# Patient Record
Sex: Female | Born: 1940 | Race: White | Hispanic: No | Smoking: Former smoker
Health system: Southern US, Community
[De-identification: ages and names within clinical notes are randomized; demographics above are authoritative.]

## PROBLEM LIST (undated history)

## (undated) DIAGNOSIS — K5792 Diverticulitis of intestine, part unspecified, without perforation or abscess without bleeding: Secondary | ICD-10-CM

## (undated) DIAGNOSIS — T7840XA Allergy, unspecified, initial encounter: Secondary | ICD-10-CM

## (undated) DIAGNOSIS — K219 Gastro-esophageal reflux disease without esophagitis: Secondary | ICD-10-CM

## (undated) DIAGNOSIS — L0390 Cellulitis, unspecified: Secondary | ICD-10-CM

## (undated) DIAGNOSIS — M8580 Other specified disorders of bone density and structure, unspecified site: Secondary | ICD-10-CM

## (undated) DIAGNOSIS — M810 Age-related osteoporosis without current pathological fracture: Secondary | ICD-10-CM

## (undated) DIAGNOSIS — I700 Atherosclerosis of aorta: Secondary | ICD-10-CM

## (undated) DIAGNOSIS — M1990 Unspecified osteoarthritis, unspecified site: Secondary | ICD-10-CM

## (undated) DIAGNOSIS — Z5189 Encounter for other specified aftercare: Secondary | ICD-10-CM

## (undated) DIAGNOSIS — R931 Abnormal findings on diagnostic imaging of heart and coronary circulation: Secondary | ICD-10-CM

## (undated) HISTORY — DX: Encounter for other specified aftercare: Z51.89

## (undated) HISTORY — PX: TUBAL LIGATION: SHX77

## (undated) HISTORY — DX: Age-related osteoporosis without current pathological fracture: M81.0

## (undated) HISTORY — DX: Unspecified osteoarthritis, unspecified site: M19.90

## (undated) HISTORY — DX: Allergy, unspecified, initial encounter: T78.40XA

## (undated) HISTORY — DX: Other specified disorders of bone density and structure, unspecified site: M85.80

## (undated) HISTORY — DX: Diverticulitis of intestine, part unspecified, without perforation or abscess without bleeding: K57.92

## (undated) HISTORY — DX: Abnormal findings on diagnostic imaging of heart and coronary circulation: R93.1

## (undated) HISTORY — DX: Atherosclerosis of aorta: I70.0

## (undated) HISTORY — PX: ABDOMINAL HYSTERECTOMY: SHX81

---

## 1979-05-23 DIAGNOSIS — Z5189 Encounter for other specified aftercare: Secondary | ICD-10-CM

## 1979-05-23 DIAGNOSIS — IMO0001 Reserved for inherently not codable concepts without codable children: Secondary | ICD-10-CM

## 1979-05-23 HISTORY — DX: Reserved for inherently not codable concepts without codable children: IMO0001

## 1979-05-23 HISTORY — PX: LAPAROTOMY: SHX154

## 1979-05-23 HISTORY — DX: Encounter for other specified aftercare: Z51.89

## 1997-12-09 ENCOUNTER — Other Ambulatory Visit: Admission: RE | Admit: 1997-12-09 | Discharge: 1997-12-09 | Payer: Self-pay | Admitting: Obstetrics and Gynecology

## 1998-10-03 ENCOUNTER — Emergency Department (HOSPITAL_COMMUNITY): Admission: EM | Admit: 1998-10-03 | Discharge: 1998-10-03 | Payer: Self-pay | Admitting: Emergency Medicine

## 1999-02-02 ENCOUNTER — Other Ambulatory Visit: Admission: RE | Admit: 1999-02-02 | Discharge: 1999-02-02 | Payer: Self-pay | Admitting: Obstetrics and Gynecology

## 1999-02-02 ENCOUNTER — Encounter (INDEPENDENT_AMBULATORY_CARE_PROVIDER_SITE_OTHER): Payer: Self-pay

## 2000-04-06 ENCOUNTER — Other Ambulatory Visit: Admission: RE | Admit: 2000-04-06 | Discharge: 2000-04-06 | Payer: Self-pay | Admitting: Obstetrics and Gynecology

## 2002-08-12 HISTORY — PX: COLONOSCOPY: SHX174

## 2002-08-22 ENCOUNTER — Other Ambulatory Visit: Admission: RE | Admit: 2002-08-22 | Discharge: 2002-08-22 | Payer: Self-pay | Admitting: Obstetrics and Gynecology

## 2004-08-05 ENCOUNTER — Other Ambulatory Visit: Admission: RE | Admit: 2004-08-05 | Discharge: 2004-08-05 | Payer: Self-pay | Admitting: Obstetrics and Gynecology

## 2004-09-23 ENCOUNTER — Ambulatory Visit: Payer: Self-pay | Admitting: Family Medicine

## 2004-09-30 ENCOUNTER — Encounter: Admission: RE | Admit: 2004-09-30 | Discharge: 2004-09-30 | Payer: Self-pay | Admitting: Family Medicine

## 2004-10-03 ENCOUNTER — Ambulatory Visit: Payer: Self-pay | Admitting: Family Medicine

## 2004-10-21 ENCOUNTER — Ambulatory Visit: Payer: Self-pay | Admitting: Family Medicine

## 2008-07-01 ENCOUNTER — Other Ambulatory Visit: Payer: Self-pay | Admitting: Emergency Medicine

## 2008-07-01 ENCOUNTER — Inpatient Hospital Stay (HOSPITAL_COMMUNITY): Admission: AD | Admit: 2008-07-01 | Discharge: 2008-07-03 | Payer: Self-pay | Admitting: Internal Medicine

## 2010-09-06 LAB — CULTURE, BLOOD (ROUTINE X 2): Culture: NO GROWTH

## 2010-09-06 LAB — DIFFERENTIAL
Basophils Absolute: 0 10*3/uL (ref 0.0–0.1)
Basophils Absolute: 0.1 10*3/uL (ref 0.0–0.1)
Eosinophils Absolute: 0.3 10*3/uL (ref 0.0–0.7)
Eosinophils Absolute: 0.3 10*3/uL (ref 0.0–0.7)
Lymphs Abs: 1.9 10*3/uL (ref 0.7–4.0)
Monocytes Relative: 13 % — ABNORMAL HIGH (ref 3–12)
Monocytes Relative: 14 % — ABNORMAL HIGH (ref 3–12)
Neutro Abs: 3.1 10*3/uL (ref 1.7–7.7)

## 2010-09-06 LAB — CBC
HCT: 39.1 % (ref 36.0–46.0)
HCT: 43.3 % (ref 36.0–46.0)
Hemoglobin: 13.9 g/dL (ref 12.0–15.0)
Hemoglobin: 14.9 g/dL (ref 12.0–15.0)
MCHC: 35.6 g/dL (ref 30.0–36.0)
MCV: 92.2 fL (ref 78.0–100.0)
Platelets: 141 10*3/uL — ABNORMAL LOW (ref 150–400)
Platelets: 153 10*3/uL (ref 150–400)
RBC: 4.24 MIL/uL (ref 3.87–5.11)
RDW: 11.8 % (ref 11.5–15.5)
WBC: 5 10*3/uL (ref 4.0–10.5)
WBC: 5.6 10*3/uL (ref 4.0–10.5)

## 2010-09-06 LAB — CLOSTRIDIUM DIFFICILE EIA: C difficile Toxins A+B, EIA: NEGATIVE

## 2010-09-06 LAB — BASIC METABOLIC PANEL
BUN: 11 mg/dL (ref 6–23)
BUN: 11 mg/dL (ref 6–23)
Calcium: 8.7 mg/dL (ref 8.4–10.5)
Calcium: 8.9 mg/dL (ref 8.4–10.5)
Chloride: 109 mEq/L (ref 96–112)
Creatinine, Ser: 0.96 mg/dL (ref 0.4–1.2)
GFR calc Af Amer: >60 mL/min (ref >60)
GFR calc Af Amer: >60 mL/min (ref >60)
GFR calc non Af Amer: >60 mL/min (ref >60)
Glucose, Bld: 111 mg/dL — ABNORMAL HIGH (ref 70–99)
Glucose, Bld: 96 mg/dL (ref 70–99)
Potassium: 3.6 mEq/L (ref 3.5–5.1)

## 2010-09-06 LAB — STOOL CULTURE

## 2010-10-04 NOTE — Discharge Summary (Signed)
[name], [name]                  ACCOUNT NO.:  [id number]   MEDICAL RECORD NO.:  [id number]          PATIENT TYPE:  INP   LOCATION:  5121                         FACILITY:  [facility]   PHYSICIAN:  Altha Harm, MDDATE OF BIRTH:  08/30/1940   DATE OF ADMISSION:  07/01/2008  DATE OF DISCHARGE:  07/03/2008                               DISCHARGE SUMMARY   DISCHARGE DISPOSITION:  Home.   FINAL DISCHARGE DIAGNOSES:  1. Cellulitis of the right head and neck.  2. Diarrhea.   DISCHARGE MEDICATIONS:  1. Augmentin 875 mg p.o. b.i.d.  2. Doxycycline 100 mg p.o. b.i.d. both for a total of 7 days.   CONSULTATIONS:  None.   PROCEDURE:  None.   DIAGNOSTICS:  CT head and neck which shows cellulitis with no extension  of infection into any cavities.   CODE STATUS:  Full code.   ALLERGIES:  VANCOMYCIN.   PRIMARY CARE PHYSICIAN:  Erskine Speed, M.D.   CHIEF COMPLAINT:  Erythema and swelling of the face.   HISTORY OF PRESENT ILLNESS:  Please see the H and P dictated by Dr.  Toniann Fail for details of the HPI.   HOSPITAL COURSE:  The patient was admitted and started on IV Zosyn and  doxycycline.  The patient had improvement in her symptoms and clinical  presentation.  CT scan of the head and neck was done to exclude any  extension of the infection into indwelling soft tissue.  The CT proves  to be negative.  The patient improved clinically.  The patient is being  discharged home on Augmentin and doxycycline for a total of 7 days.   The patient had some diarrhea prior to discharge from the hospital.  This could certainly be nonspecific.  However, the patient has been on  antibiotics.  Thus the stool is being sent off for C. diff and culture.  The patient has been instructed to call the hospital tomorrow at [phone] and ask to speak to Dr. Ashley Royalty who will give her the results of  the C. diff test.  If it is positive, then I will at that time prescribe  an appropriate antibiotic  to the patient.  Nevertheless, the patient is  instructed to follow up with Dr. Chilton Si on Monday in the office.  I would  ask that Dr. Thomasene Lot office also follow up on the stool cultures here in  the event that the patient does not follow through with her end of  calling here tomorrow.  I have obtained the patient's telephone number  and will make every attempt to call her if I do not hear from her  tomorrow.   DIETARY RESTRICTIONS:  None.   PHYSICAL RESTRICTIONS:  None.   FOLLOW UP:  The patient is to follow up with Dr. Nila Nephew in his  office on Monday which is in 3 days.   Time spent in discharge process 47 minutes      Altha Harm, MD  Electronically Signed     MAM/MEDQ  D:  07/03/2008  T:  07/03/2008  Job:  [id number]   cc:   Erskine Speed, M.D.

## 2010-10-04 NOTE — H&P (Signed)
NAMEMARCINA, [name]                  ACCOUNT NO.:  [id number]   MEDICAL RECORD NO.:  [id number]          PATIENT TYPE:  INP   LOCATION:  5121                         FACILITY:  [facility]   PHYSICIAN:  Eduard Clos, MDDATE OF BIRTH:  25-Mar-1941   DATE OF ADMISSION:  07/01/2008  DATE OF DISCHARGE:                              HISTORY & PHYSICAL   PRIMARY CARE PHYSICIAN:  Erskine Speed, M.D.   CHIEF COMPLAINT:  Erythema and swelling of the face.   HISTORY OF PRESENT ILLNESS:  A 70 year old female with no significant  past medical history presented to the ER because of increasing erythema  and swelling on the face.  Patient noticed erythema and swelling of the  right ear 2 days ago, which slowly evolved and started involving the  right side of the face.   Patient initially had a mild headache 2 days ago.  Also had some  swelling in the jugular digastric area of the right side.  She thought  she may be developing streptococcal pharyngitis, as other people in the  family and the house had streptococcal pharyngitis.  Eventually, she  started noticing swelling in the right ear, which gradually worsened and  started involving her face on the right side.  Initially, she also had  some nausea, vomiting, and headache, which has completely resolved  totally on its own.  Denies any insect bite or trauma.  Since the  symptoms have grown worse, she decided to come to the ER at [facility].   At [facility], patient was given vancomycin and was planned to be  admitted for further management of cellulitis.  While receiving  vancomycin IV, patient had intense itching of face, upper anterior, and  posterior chest.  She was given Benadryl and Pepcid, after which her  itching and erythema promptly resolved of the chest.   Patient denies any shortness of breath, tongue swelling.  Denies any  abdominal pain, chest pain, shortness of breath, loss of consciousness.  Denies any headache now.  Denies any  dysuria, discharge, or diarrhea.   PAST MEDICAL HISTORY:  Nothing significant.   PAST SURGICAL HISTORY:  1. Tonsillectomy.  2. Tubal ligation.   MEDICATIONS PRIOR TO ADMISSION:  None.   ALLERGIES:  No known drug allergies.   FAMILY HISTORY:  Not available.  Patient is adopted.   SOCIAL HISTORY:  Patient does not smoke cigarettes.  Drinks wine  occasionally.  Denies any drug abuse.   REVIEW OF SYSTEMS:  As per the history of present illness, nothing else  significant.   PHYSICAL EXAMINATION:  Patient examined at bedside.  Not in acute  distress.  VITAL SIGNS:  Blood pressure is 112/75, pulse 77 per minute, temperature  99.2, respirations 18 per minute, O2 sat 99%.  HEENT:  There is erythema and swelling of the right ear, involving the  whole ear with some extension to the right side of the face with some  induration involving the face from the jugular digastric area to the  lateral aspect of the right eyebrow.  Patient is able to open her  mouth  without any difficulty.  There is no tongue swelling or any conjunctival  injection.  No pallor.  Anicteric.  The left side of the face and ears  are normal.  At this time, there are no palpable lymph nodes.  There is  no neck rigidity.  CHEST:  Bilateral air entry present.  No rhonchi, no crepitation.  HEART:  S1 and S2 heard.  ABDOMEN:  Soft.  Nontender.  Bowel sounds heard.  CNS:  Awake, alert and oriented to time, place, and person.  Moves upper  and lower extremities 5/5.  EXTREMITIES:  Peripheral pulses felt.  No edema.   LABS:  CBC:  WBC is 5.6, hemoglobin 14.9, hematocrit 43.3, platelets  143, neutrophils 56%.  Basic metabolic panel:  Sodium 138, potassium  3.8, chloride 103, carbon dioxide 28, glucose 96, BUN 11, creatinine  0.9, calcium 8.9.   ASSESSMENT:  1. Facial cellulitis.  2. Possible allergic reaction to vancomycin versus red man reaction.   PLAN:  1. Admit patient to medical floor.  2. Start patient on IV  doxycycline and Zosyn.  Patient has already had      blood cultures obtained at the ER.  Will follow those blood      cultures.  3. Will repeat a CBC and a BMET in the a.m.  4. Further recommendation as condition evolves.      Eduard Clos, MD  Electronically Signed     ANK/MEDQ  D:  07/01/2008  T:  07/02/2008  Job:  [id number]

## 2010-10-04 NOTE — H&P (Signed)
[name]                  ACCOUNT NO.:  [id number]   MEDICAL RECORD NO.:  [id number]          PATIENT TYPE:  INP   LOCATION:  5121                         FACILITY:  [facility]   PHYSICIAN:  Eduard Clos, MDDATE OF BIRTH:  1940/08/02   DATE OF ADMISSION:  07/01/2008  DATE OF DISCHARGE:                              HISTORY & PHYSICAL   PRIMARY CARE PHYSICIAN:  Erskine Speed, M.D.   CHIEF COMPLAINT:  Erythema and swelling of the face.   HISTORY OF PRESENT ILLNESS:  A 70 year old female with no significant  past medical history presented to the ER complaining of increasing  swelling and erythema of her face.  Patient states that the erythema and  swelling started 2 days ago in her right ear, gradually increased and  involved the right side of her face.  Patient initially had a headache,  was has resolved completely on Monday, so that is 2 days ago.  She also  had some nausea and vomiting.  On Monday, 2 days ago, patient also had  some fever and chills, which lasted a few hours.  Patient denies any  insect bite.  Denies any trauma.  Patient states that in her family,  some people had streptococcal pharyngitis.  Felt that she may be  developing that, as she had some lymph node swelling of the neck, which  at this time has resolved.  She eventually noted swelling in the right  ear, which totally worsened.  With all of the above-mentioned features,  patient planned to come to the ER.  In the ER, patient was given  vancomycin.   DICTATION ENDED AT THIS POINT,      Eduard Clos, MD  Electronically Signed     ANK/MEDQ  D:  07/01/2008  T:  07/02/2008  Job:  [id number]

## 2011-07-21 ENCOUNTER — Other Ambulatory Visit: Payer: Self-pay

## 2011-07-21 ENCOUNTER — Encounter (HOSPITAL_COMMUNITY): Payer: Self-pay

## 2011-07-21 ENCOUNTER — Encounter (HOSPITAL_COMMUNITY)
Admission: RE | Admit: 2011-07-21 | Discharge: 2011-07-21 | Disposition: A | Discharge status: Home / Self Care | Payer: Medicare Other | Source: Ambulatory Visit | Attending: Obstetrics and Gynecology | Admitting: Obstetrics and Gynecology

## 2011-07-21 HISTORY — DX: Gastro-esophageal reflux disease without esophagitis: K21.9

## 2011-07-21 HISTORY — DX: Encounter for other specified aftercare: Z51.89

## 2011-07-21 LAB — CBC
HCT: 46.3 % — ABNORMAL HIGH (ref 36.0–46.0)
Hemoglobin: 15.5 g/dL — ABNORMAL HIGH (ref 12.0–15.0)
Platelets: 173 10*3/uL (ref 150–400)
WBC: 5.6 10*3/uL (ref 4.0–10.5)

## 2011-07-21 NOTE — H&P (Signed)
  Patient name  [name], Setterlund DICTATION# [id number] CSN# [id number]  Juluis Mire, MD 07/21/2011 5:56 AM

## 2011-07-21 NOTE — H&P (Signed)
NAME:  [name], [name] NO.:  [id number]  MEDICAL RECORD NO.:  [id number]  LOCATION:                                 FACILITY:  PHYSICIAN:  Juluis Mire, M.D.        DATE OF BIRTH:  DATE OF ADMISSION: DATE OF DISCHARGE:                             HISTORY & PHYSICAL   DATE OF SURGERY:  July 25, 2011, this will be done at [facility].  HISTORY OF PRESENT ILLNESS:  The patient is a 71 year old, gravida 3, para 2 postmenopausal patient who presents for management of pelvic relaxation and stress incontinence.  She is scheduled to go under laparoscopic-assisted vaginal hysterectomy with bilateral salpingo- oophorectomy.  Anterior and posterior repair.  Mid urethral sling with cystoscopy.  Possible sacrospinous ligament suspension.  In relation to present admission, the patient has been having trouble with increasing pelvic relaxation.  At this point in time, she feels something bulging to the vaginal opening.  She states uncomfortable for her to sit.  She is also having trouble with worsening stress incontinence.  On evaluation, she has a severe cystocele with severe uterine descensus, moderate rectocele, cuff is intact.  Her bimanual exam is unremarkable.  She did undergo urodynamic testing.  She did leak with normal leak point pressure.  She had borderline urethral pressure profiles but otherwise urodynamics were unremarkable.  Options were discussed including conservative followup versus pessary versus the above-noted surgery for which she is admitted at the present time.  ALLERGIES:  No known drug allergies.  MEDICATIONS:  No medications listed.  PAST MEDICAL HISTORY:  Usual childhood diseases.  No significant sequelae.  SURGICAL HISTORY:  She had ectopic pregnancy treated in 1975.  She had a bilateral tubal ligation in 1978, and she has had 2 vaginal deliveries.  SOCIAL HISTORY:  No tobacco or alcohol use.  FAMILY HISTORY:   Noncontributory.  REVIEW OF SYSTEMS:  Noncontributory.  PHYSICAL EXAMINATION:  VITAL SIGNS:  The patient is afebrile with stable vital signs. HEENT:  The patient is normocephalic.  Pupils are equal, round, and reactive to light and accommodation.  Extraocular movements are intact. Sclerae and conjunctivae are clear.  Oropharynx clear. NECK:  No thyromegaly. BREASTS:  Not examined. LUNGS:  Clear. CARDIOVASCULAR:  Regular rate without murmurs or gallops. ABDOMEN:  Benign.  No mass, organomegaly, or tenderness. PELVIC:  Normal external genitalia.  Vaginal mucosa reveals a severe cystocele protruding outside the introitus along with the uterus. Posterior she has a minimal-to-moderate rectocele, cuff is intact. Bimanual exam unremarkable.  Uterus normal size and shape.  Adnexa unremarkable. EXTREMITIES:  Trace edema. NEUROLOGIC:  Grossly within normal limits.  IMPRESSION:  Worsening pelvic relaxation with stress incontinence.  PLAN:  The patient will undergo laparoscopic-assisted vaginal hysterectomy with removal of both tubes and ovaries.  At that point, we will proceed with an anterior and posterior repair with possible sacrospinous ligament suspension.  We will also perform a mid urethral sling with cystoscopy.  Potential recurrence of pelvic relaxation has been discussed with further need for management.  Risk of surgery explained including the risk of infection.  The risk of hemorrhage could require transfusion with the  risk of AIDS or hepatitis.  Risk of injury to adjacent organs including bladder, bowel, ureters that could require further exploratory surgery.  Risk of deep venous thrombosis and pulmonary embolus.  Placing of the sling, we have discussed potential risks and complications.  If the sling is over tightened, this can lead to an obstructive voiding pattern requiring loosening the sling with the possible return of incontinence.  There is a risk of mesh erosion  that can require surgical management.  Rare risk of mesh rejection, leading to chronic pelvic pain and dyspareunia.  Discussed potential development of bladder spasms require medical therapy.  Also explained the risk of obturator nerve injury, with chronic leg pain and weakness.  The sacrospinous ligament suspension, there is a risk of pudendal nerve or artery injury and subsequent complications.  The patient does understand indications, risks, and alternatives.     Juluis Mire, M.D.     JSM/MEDQ  D:  07/21/2011  T:  07/21/2011  Job:  [id number]

## 2011-07-21 NOTE — Patient Instructions (Signed)
YOUR PROCEDURE IS SCHEDULED ON:07/25/11  ENTER THROUGH THE MAIN ENTRANCE OF [facility] AT:6am  USE DESK PHONE AND DIAL [id number] TO INFORM us OF YOUR ARRIVAL  CALL [phone] IF YOU HAVE ANY QUESTIONS OR PROBLEMS PRIOR TO YOUR ARRIVAL.  REMEMBER: DO NOT EAT OR DRINK AFTER MIDNIGHT : Monday  SPECIAL INSTRUCTIONS:   YOU MAY BRUSH YOUR TEETH THE MORNING OF SURGERY   TAKE THESE MEDICINES THE DAY OF SURGERY WITH SIP OF WATER:none   DO NOT WEAR JEWELRY, EYE MAKEUP, LIPSTICK OR DARK FINGERNAIL POLISH DO NOT WEAR LOTIONS  DO NOT SHAVE FOR 48 HOURS PRIOR TO SURGERY  YOU WILL NOT BE ALLOWED TO DRIVE YOURSELF HOME.  NAME OF DRIVER:Al Lucretia Roers

## 2011-07-24 MED ORDER — CEFAZOLIN SODIUM 1-5 GM-% IV SOLN
1.0000 g | INTRAVENOUS | Status: AC
  Administered 2011-07-25: 1 g via INTRAVENOUS

## 2011-07-25 ENCOUNTER — Encounter (HOSPITAL_COMMUNITY)
Admission: RE | Disposition: A | Discharge status: Home / Self Care | Payer: Self-pay | Source: Ambulatory Visit | Attending: Obstetrics and Gynecology

## 2011-07-25 ENCOUNTER — Encounter (HOSPITAL_COMMUNITY): Payer: Self-pay | Admitting: Anesthesiology

## 2011-07-25 ENCOUNTER — Ambulatory Visit (HOSPITAL_COMMUNITY)
Admission: RE | Admit: 2011-07-25 | Discharge: 2011-07-26 | Disposition: A | Discharge status: Home / Self Care | Payer: Medicare Other | Source: Ambulatory Visit | Attending: Obstetrics and Gynecology | Admitting: Obstetrics and Gynecology

## 2011-07-25 ENCOUNTER — Inpatient Hospital Stay (HOSPITAL_COMMUNITY): Payer: Medicare Other | Admitting: Anesthesiology

## 2011-07-25 ENCOUNTER — Encounter (HOSPITAL_COMMUNITY): Payer: Self-pay | Admitting: *Deleted

## 2011-07-25 DIAGNOSIS — N8189 Other female genital prolapse: Secondary | ICD-10-CM

## 2011-07-25 DIAGNOSIS — R32 Unspecified urinary incontinence: Secondary | ICD-10-CM

## 2011-07-25 DIAGNOSIS — N393 Stress incontinence (female) (male): Secondary | ICD-10-CM | POA: Insufficient documentation

## 2011-07-25 DIAGNOSIS — N840 Polyp of corpus uteri: Secondary | ICD-10-CM | POA: Insufficient documentation

## 2011-07-25 DIAGNOSIS — Z01812 Encounter for preprocedural laboratory examination: Secondary | ICD-10-CM | POA: Insufficient documentation

## 2011-07-25 DIAGNOSIS — N812 Incomplete uterovaginal prolapse: Secondary | ICD-10-CM | POA: Insufficient documentation

## 2011-07-25 HISTORY — PX: CYSTOSCOPY: SHX5120

## 2011-07-25 HISTORY — PX: ANTERIOR AND POSTERIOR REPAIR: SHX5121

## 2011-07-25 HISTORY — PX: LAPAROSCOPIC ASSISTED VAGINAL HYSTERECTOMY: SHX5398

## 2011-07-25 HISTORY — PX: SALPINGOOPHORECTOMY: SHX82

## 2011-07-25 HISTORY — PX: BLADDER SUSPENSION: SHX72

## 2011-07-25 SURGERY — HYSTERECTOMY, VAGINAL, LAPAROSCOPY-ASSISTED
Anesthesia: General | Site: Vagina | Wound class: Clean Contaminated

## 2011-07-25 MED ORDER — KETOROLAC TROMETHAMINE 30 MG/ML IJ SOLN
INTRAMUSCULAR | Status: AC
  Filled 2011-07-25: qty 1

## 2011-07-25 MED ORDER — LACTATED RINGERS IV SOLN
INTRAVENOUS | Status: DC
  Administered 2011-07-25: 13:00:00 via INTRAVENOUS

## 2011-07-25 MED ORDER — MORPHINE SULFATE 10 MG/ML IJ SOLN
INTRAMUSCULAR | Status: DC | PRN
  Administered 2011-07-25: 2 mg via INTRAVENOUS
  Administered 2011-07-25: 3 mg via INTRAVENOUS
  Administered 2011-07-25: 2 mg via INTRAVENOUS
  Administered 2011-07-25: 3 mg via INTRAVENOUS

## 2011-07-25 MED ORDER — GLYCOPYRROLATE 0.2 MG/ML IJ SOLN
INTRAMUSCULAR | Status: AC
  Filled 2011-07-25: qty 1

## 2011-07-25 MED ORDER — DIPHENHYDRAMINE HCL 50 MG/ML IJ SOLN: 12.5000 mg | Freq: Four times a day (QID) | INTRAMUSCULAR | Status: DC | PRN

## 2011-07-25 MED ORDER — ONDANSETRON HCL 4 MG/2ML IJ SOLN
INTRAMUSCULAR | Status: DC | PRN
  Administered 2011-07-25: 4 mg via INTRAVENOUS

## 2011-07-25 MED ORDER — KETOROLAC TROMETHAMINE 30 MG/ML IJ SOLN
INTRAMUSCULAR | Status: DC | PRN
  Administered 2011-07-25: 30 mg via INTRAVENOUS

## 2011-07-25 MED ORDER — INDIGOTINDISULFONATE SODIUM 8 MG/ML IJ SOLN
INTRAMUSCULAR | Status: DC | PRN
  Administered 2011-07-25: 5 mL via INTRAVENOUS

## 2011-07-25 MED ORDER — OXYCODONE-ACETAMINOPHEN 5-325 MG PO TABS
1.0000 | ORAL_TABLET | ORAL | Status: DC | PRN
  Administered 2011-07-26: 1 via ORAL
  Filled 2011-07-25: qty 1

## 2011-07-25 MED ORDER — LIDOCAINE HCL (CARDIAC) 20 MG/ML IV SOLN
INTRAVENOUS | Status: AC
  Filled 2011-07-25: qty 5

## 2011-07-25 MED ORDER — ROCURONIUM BROMIDE 50 MG/5ML IV SOLN
INTRAVENOUS | Status: AC
  Filled 2011-07-25: qty 1

## 2011-07-25 MED ORDER — SODIUM CHLORIDE 0.9 % IJ SOLN: 9.0000 mL | INTRAMUSCULAR | Status: DC | PRN

## 2011-07-25 MED ORDER — PHENYLEPHRINE 40 MCG/ML (10ML) SYRINGE FOR IV PUSH (FOR BLOOD PRESSURE SUPPORT)
PREFILLED_SYRINGE | INTRAVENOUS | Status: AC
  Filled 2011-07-25: qty 5

## 2011-07-25 MED ORDER — PROPOFOL 10 MG/ML IV EMUL
INTRAVENOUS | Status: AC
  Filled 2011-07-25: qty 20

## 2011-07-25 MED ORDER — ZOLPIDEM TARTRATE 5 MG PO TABS: 5.0000 mg | ORAL_TABLET | Freq: Every evening | ORAL | Status: DC | PRN

## 2011-07-25 MED ORDER — PROPOFOL 10 MG/ML IV EMUL
INTRAVENOUS | Status: DC | PRN
  Administered 2011-07-25: 140 mg via INTRAVENOUS

## 2011-07-25 MED ORDER — ONDANSETRON HCL 4 MG/2ML IJ SOLN: 4.0000 mg | Freq: Four times a day (QID) | INTRAMUSCULAR | Status: DC | PRN

## 2011-07-25 MED ORDER — BUPIVACAINE HCL (PF) 0.25 % IJ SOLN
INTRAMUSCULAR | Status: DC | PRN
  Administered 2011-07-25: 7 mL

## 2011-07-25 MED ORDER — BUPIVACAINE-EPINEPHRINE (PF) 0.5% -1:200000 IJ SOLN
INTRAMUSCULAR | Status: AC
  Filled 2011-07-25: qty 10

## 2011-07-25 MED ORDER — ACETAMINOPHEN 325 MG PO TABS
650.0000 mg | ORAL_TABLET | ORAL | Status: DC | PRN
  Administered 2011-07-25 (×2): 650 mg via ORAL
  Filled 2011-07-25 (×2): qty 2

## 2011-07-25 MED ORDER — LIDOCAINE-EPINEPHRINE (PF) 1 %-1:200000 IJ SOLN
INTRAMUSCULAR | Status: DC | PRN
  Administered 2011-07-25: 30 mL

## 2011-07-25 MED ORDER — DIPHENHYDRAMINE HCL 12.5 MG/5ML PO ELIX: 12.5000 mg | ORAL_SOLUTION | Freq: Four times a day (QID) | ORAL | Status: DC | PRN

## 2011-07-25 MED ORDER — CEFAZOLIN SODIUM 1-5 GM-% IV SOLN
INTRAVENOUS | Status: AC
  Filled 2011-07-25: qty 50

## 2011-07-25 MED ORDER — SCOPOLAMINE 1 MG/3DAYS TD PT72
1.0000 | MEDICATED_PATCH | TRANSDERMAL | Status: DC
  Administered 2011-07-25: 1.5 mg via TRANSDERMAL

## 2011-07-25 MED ORDER — GLYCOPYRROLATE 0.2 MG/ML IJ SOLN
INTRAMUSCULAR | Status: DC | PRN
  Administered 2011-07-25: 0.4 mg via INTRAVENOUS

## 2011-07-25 MED ORDER — PANTOPRAZOLE SODIUM 40 MG PO TBEC
40.0000 mg | DELAYED_RELEASE_TABLET | Freq: Once | ORAL | Status: AC
  Administered 2011-07-25: 40 mg via ORAL

## 2011-07-25 MED ORDER — SCOPOLAMINE 1 MG/3DAYS TD PT72
MEDICATED_PATCH | TRANSDERMAL | Status: AC
  Filled 2011-07-25: qty 1

## 2011-07-25 MED ORDER — BUPIVACAINE HCL (PF) 0.25 % IJ SOLN
INTRAMUSCULAR | Status: AC
  Filled 2011-07-25: qty 30

## 2011-07-25 MED ORDER — EPHEDRINE 5 MG/ML INJ
INTRAVENOUS | Status: AC
  Filled 2011-07-25: qty 10

## 2011-07-25 MED ORDER — DEXAMETHASONE SODIUM PHOSPHATE 4 MG/ML IJ SOLN
INTRAMUSCULAR | Status: DC | PRN
  Administered 2011-07-25: 4 mg via INTRAVENOUS

## 2011-07-25 MED ORDER — ONDANSETRON HCL 4 MG/2ML IJ SOLN
INTRAMUSCULAR | Status: AC
  Filled 2011-07-25: qty 2

## 2011-07-25 MED ORDER — HYDROMORPHONE HCL PF 1 MG/ML IJ SOLN: 0.2500 mg | INTRAMUSCULAR | Status: DC | PRN

## 2011-07-25 MED ORDER — MORPHINE SULFATE 10 MG/ML IJ SOLN
INTRAMUSCULAR | Status: AC
  Filled 2011-07-25: qty 1

## 2011-07-25 MED ORDER — MIDAZOLAM HCL 2 MG/2ML IJ SOLN
INTRAMUSCULAR | Status: AC
  Filled 2011-07-25: qty 2

## 2011-07-25 MED ORDER — STERILE WATER FOR IRRIGATION IR SOLN
Status: DC | PRN
  Administered 2011-07-25: 09:00:00 via INTRAVESICAL

## 2011-07-25 MED ORDER — MENTHOL 3 MG MT LOZG: 1.0000 | LOZENGE | OROMUCOSAL | Status: DC | PRN

## 2011-07-25 MED ORDER — ROCURONIUM BROMIDE 100 MG/10ML IV SOLN
INTRAVENOUS | Status: DC | PRN
  Administered 2011-07-25: 30 mg via INTRAVENOUS

## 2011-07-25 MED ORDER — MIDAZOLAM HCL 5 MG/5ML IJ SOLN
INTRAMUSCULAR | Status: DC | PRN
  Administered 2011-07-25: 1 mg via INTRAVENOUS

## 2011-07-25 MED ORDER — FENTANYL CITRATE 0.05 MG/ML IJ SOLN
INTRAMUSCULAR | Status: AC
  Filled 2011-07-25: qty 5

## 2011-07-25 MED ORDER — NEOSTIGMINE METHYLSULFATE 1 MG/ML IJ SOLN
INTRAMUSCULAR | Status: AC
  Filled 2011-07-25: qty 10

## 2011-07-25 MED ORDER — EPHEDRINE SULFATE 50 MG/ML IJ SOLN
INTRAMUSCULAR | Status: DC | PRN
  Administered 2011-07-25: 10 mg via INTRAVENOUS

## 2011-07-25 MED ORDER — METOCLOPRAMIDE HCL 5 MG/ML IJ SOLN: 10.0000 mg | Freq: Once | INTRAMUSCULAR | Status: DC | PRN

## 2011-07-25 MED ORDER — FENTANYL CITRATE 0.05 MG/ML IJ SOLN
INTRAMUSCULAR | Status: DC | PRN
  Administered 2011-07-25: 25 ug via INTRAVENOUS
  Administered 2011-07-25: 100 ug via INTRAVENOUS

## 2011-07-25 MED ORDER — ONDANSETRON HCL 4 MG PO TABS: 4.0000 mg | ORAL_TABLET | Freq: Four times a day (QID) | ORAL | Status: DC | PRN

## 2011-07-25 MED ORDER — STERILE WATER FOR IRRIGATION IR SOLN
Status: DC | PRN
  Administered 2011-07-25: 1000 mL via INTRAVESICAL

## 2011-07-25 MED ORDER — NALOXONE HCL 0.4 MG/ML IJ SOLN: 0.4000 mg | INTRAMUSCULAR | Status: DC | PRN

## 2011-07-25 MED ORDER — ESTRADIOL 0.1 MG/GM VA CREA
TOPICAL_CREAM | VAGINAL | Status: AC
  Filled 2011-07-25: qty 42.5

## 2011-07-25 MED ORDER — LIDOCAINE-EPINEPHRINE (PF) 1 %-1:200000 IJ SOLN
INTRAMUSCULAR | Status: AC
  Filled 2011-07-25: qty 10

## 2011-07-25 MED ORDER — HYDROMORPHONE 0.3 MG/ML IV SOLN: INTRAVENOUS | Status: DC

## 2011-07-25 MED ORDER — LIDOCAINE HCL (CARDIAC) 20 MG/ML IV SOLN
INTRAVENOUS | Status: DC | PRN
  Administered 2011-07-25: 50 mg via INTRAVENOUS

## 2011-07-25 MED ORDER — PANTOPRAZOLE SODIUM 40 MG PO TBEC
DELAYED_RELEASE_TABLET | ORAL | Status: AC
  Filled 2011-07-25: qty 1

## 2011-07-25 MED ORDER — DEXAMETHASONE SODIUM PHOSPHATE 10 MG/ML IJ SOLN
INTRAMUSCULAR | Status: AC
  Filled 2011-07-25: qty 1

## 2011-07-25 MED ORDER — NEOSTIGMINE METHYLSULFATE 1 MG/ML IJ SOLN
INTRAMUSCULAR | Status: DC | PRN
  Administered 2011-07-25: 3 mg via INTRAVENOUS

## 2011-07-25 MED ORDER — LACTATED RINGERS IV SOLN
INTRAVENOUS | Status: DC
  Administered 2011-07-25: 1000 mL via INTRAVENOUS
  Administered 2011-07-25: 09:00:00 via INTRAVENOUS

## 2011-07-25 MED ORDER — MEPERIDINE HCL 25 MG/ML IJ SOLN: 6.2500 mg | INTRAMUSCULAR | Status: DC | PRN

## 2011-07-25 SURGICAL SUPPLY — 50 items
BANDAGE GAUZE ELAST BULKY 4 IN (GAUZE/BANDAGES/DRESSINGS) ×6 IMPLANT
BLADE SURG 15 STRL LF C SS BP (BLADE) ×5 IMPLANT
BLADE SURG 15 STRL SS (BLADE) ×1
CANISTER SUCTION 2500CC (MISCELLANEOUS) ×6 IMPLANT
CATH ROBINSON RED A/P 16FR (CATHETERS) ×6 IMPLANT
CLOTH BEACON ORANGE TIMEOUT ST (SAFETY) ×6 IMPLANT
CONT PATH 16OZ SNAP LID 3702 (MISCELLANEOUS) ×6 IMPLANT
COVER TABLE BACK 60X90 (DRAPES) ×6 IMPLANT
DECANTER SPIKE VIAL GLASS SM (MISCELLANEOUS) ×6 IMPLANT
DERMABOND ADVANCED (GAUZE/BANDAGES/DRESSINGS) ×1
DERMABOND ADVANCED .7 DNX12 (GAUZE/BANDAGES/DRESSINGS) ×5 IMPLANT
DEVICE CAPIO SUTURING (INSTRUMENTS) ×1
DEVICE CAPIO SUTURING OPC (INSTRUMENTS) ×5 IMPLANT
ELECT REM PT RETURN 9FT ADLT (ELECTROSURGICAL) ×6
ELECTRODE REM PT RTRN 9FT ADLT (ELECTROSURGICAL) ×5 IMPLANT
GAUZE KERLIX 2  STERILE LF (GAUZE/BANDAGES/DRESSINGS) ×6 IMPLANT
GAUZE SPONGE 4X4 16PLY XRAY LF (GAUZE/BANDAGES/DRESSINGS) ×6 IMPLANT
GLOVE BIO SURGEON STRL SZ7 (GLOVE) ×18 IMPLANT
GLOVE BIOGEL PI IND STRL 6.5 (GLOVE) ×5 IMPLANT
GLOVE BIOGEL PI INDICATOR 6.5 (GLOVE) ×1
GOWN PREVENTION PLUS LG XLONG (DISPOSABLE) ×24 IMPLANT
NEEDLE HYPO 22GX1.5 SAFETY (NEEDLE) ×6 IMPLANT
NEEDLE MAYO 6 CRC TAPER PT (NEEDLE) ×6 IMPLANT
NS IRRIG 1000ML POUR BTL (IV SOLUTION) ×6 IMPLANT
PACK LAVH (CUSTOM PROCEDURE TRAY) ×6 IMPLANT
PROTECTOR NERVE ULNAR (MISCELLANEOUS) ×6 IMPLANT
SEALER TISSUE G2 CVD JAW 45CM (ENDOMECHANICALS) ×6 IMPLANT
SET CYSTO W/LG BORE CLAMP LF (SET/KITS/TRAYS/PACK) ×6 IMPLANT
SET IRRIG TUBING LAPAROSCOPIC (IRRIGATION / IRRIGATOR) ×6 IMPLANT
SLING HALO OBTRYX (Sling) ×1 IMPLANT
SLING HALO OBTRYX NDL (Sling) ×5 IMPLANT
SUT CAPIO POLYGLYCOLIC (SUTURE) ×6 IMPLANT
SUT MON AB 2-0 CT1 27 (SUTURE) ×6 IMPLANT
SUT VIC AB 0 CT1 18XCR BRD8 (SUTURE) ×10 IMPLANT
SUT VIC AB 0 CT1 27 (SUTURE) ×1
SUT VIC AB 0 CT1 27XBRD ANBCTR (SUTURE) ×5 IMPLANT
SUT VIC AB 0 CT1 36 (SUTURE) ×12 IMPLANT
SUT VIC AB 0 CT1 8-18 (SUTURE) ×2
SUT VIC AB 2-0 CT1 27 (SUTURE) ×2
SUT VIC AB 2-0 CT1 TAPERPNT 27 (SUTURE) ×10 IMPLANT
SUT VIC AB 2-0 CT2 27 (SUTURE) ×72 IMPLANT
SUT VIC AB 2-0 UR6 27 (SUTURE) ×12 IMPLANT
SUT VICRYL 0 UR6 27IN ABS (SUTURE) ×12 IMPLANT
SUT VICRYL 1 TIES 12X18 (SUTURE) ×6 IMPLANT
SUT VICRYL 4-0 PS2 18IN ABS (SUTURE) ×6 IMPLANT
TOWEL OR 17X24 6PK STRL BLUE (TOWEL DISPOSABLE) ×12 IMPLANT
TRAY FOLEY CATH 14FR (SET/KITS/TRAYS/PACK) ×6 IMPLANT
TROCAR BALLN 12MMX100 BLUNT (TROCAR) ×6 IMPLANT
WARMER LAPAROSCOPE (MISCELLANEOUS) ×6 IMPLANT
WATER STERILE IRR 1000ML POUR (IV SOLUTION) ×6 IMPLANT

## 2011-07-25 NOTE — Anesthesia Postprocedure Evaluation (Signed)
Anesthesia Post Note  Patient: ZYONNA VARDAMAN  Procedure(s) Performed: Procedure(s) (LRB): LAPAROSCOPIC ASSISTED VAGINAL HYSTERECTOMY (N/A) SALPINGO OOPHERECTOMY (Bilateral) ANTERIOR (CYSTOCELE) AND POSTERIOR REPAIR (RECTOCELE) (N/A) TRANSVAGINAL TAPE (TVT) PROCEDURE (N/A) CYSTOSCOPY (N/A)  Anesthesia type: General  Patient location: PACU  Post pain: Pain level controlled  Post assessment: Post-op Vital signs reviewed  Last Vitals:  Filed Vitals:   07/25/11 1045  BP:   Pulse: 71  Temp: 36.7 C  Resp: 14    Post vital signs: Reviewed  Level of consciousness: sedated  Complications: No apparent anesthesia complications

## 2011-07-25 NOTE — Anesthesia Postprocedure Evaluation (Signed)
  Anesthesia Post-op Note  Patient: [name]  Procedure(s) Performed: Procedure(s) (LRB): LAPAROSCOPIC ASSISTED VAGINAL HYSTERECTOMY (N/A) SALPINGO OOPHERECTOMY (Bilateral) ANTERIOR (CYSTOCELE) AND POSTERIOR REPAIR (RECTOCELE) (N/A) TRANSVAGINAL TAPE (TVT) PROCEDURE (N/A) CYSTOSCOPY (N/A)  Patient Location: PACU and Women's Unit  Anesthesia Type: General  Level of Consciousness: awake, alert  and oriented  Airway and Oxygen Therapy: Patient Spontanous Breathing  Post-op Pain: none  Post-op Assessment: Post-op Vital signs reviewed, Patient's Cardiovascular Status Stable, Respiratory Function Stable, Patent Airway, No signs of Nausea or vomiting, Adequate PO intake and Pain level controlled  Post-op Vital Signs: Reviewed and stable  Complications: No apparent anesthesia complications

## 2011-07-25 NOTE — Anesthesia Preprocedure Evaluation (Addendum)
Anesthesia Evaluation  Patient identified by MRN, date of birth, ID band Patient awake    Reviewed: Allergy & Precautions, H&P , NPO status , Patient's Chart, lab work & pertinent test results  Airway Mallampati: I TM Distance: >3 FB Neck ROM: Full    Dental No notable dental hx. (+) Teeth Intact   Pulmonary neg pulmonary ROS,  breath sounds clear to auscultation  Pulmonary exam normal       Cardiovascular negative cardio ROS  Rhythm:Regular Rate:Normal     Neuro/Psych negative neurological ROS  negative psych ROS   GI/Hepatic negative GI ROS, Neg liver ROS, GERD-  ,  Endo/Other  negative endocrine ROS  Renal/GU negative Renal ROS  negative genitourinary   Musculoskeletal negative musculoskeletal ROS (+)   Abdominal Normal abdominal exam  (+)  Abdomen: soft.    Peds  Hematology negative hematology ROS (+)   Anesthesia Other Findings   Reproductive/Obstetrics negative OB ROS                          Anesthesia Physical Anesthesia Plan  ASA: I  Anesthesia Plan: General   Post-op Pain Management:    Induction: Intravenous  Airway Management Planned: Oral ETT  Additional Equipment:   Intra-op Plan:   Post-operative Plan: Extubation in OR  Informed Consent: I have reviewed the patients History and Physical, chart, labs and discussed the procedure including the risks, benefits and alternatives for the proposed anesthesia with the patient or authorized representative who has indicated his/her understanding and acceptance.   Dental advisory given  Plan Discussed with: CRNA, Anesthesiologist and Surgeon  Anesthesia Plan Comments:         Anesthesia Quick Evaluation

## 2011-07-25 NOTE — Brief Op Note (Signed)
07/25/2011  9:43 AM  PATIENT:  [name]  71 y.o. female  PRE-OPERATIVE DIAGNOSIS:  Pelvic Relaxation  POST-OPERATIVE DIAGNOSIS:  Pelvic Relaxation  PROCEDURE:  Procedure(s) (LRB): LAPAROSCOPIC ASSISTED VAGINAL HYSTERECTOMY (N/A) SALPINGO OOPHERECTOMY (Bilateral) ANTERIOR (CYSTOCELE) AND POSTERIOR REPAIR (RECTOCELE) (N/A) TRANSVAGINAL TAPE (TVT) PROCEDURE (N/A) CYSTOSCOPY (N/A)  SURGEON:  Surgeon(s) and Role:    * Juluis Mire, MD - Primary    * Meriel Pica, MD - Assisting  PHYSICIAN ASSISTANT:   ASSISTANTS: holland   ANESTHESIA:   general  EBL:  Total I/O In: 1000 [I.V.:1000] Out: 50 [Blood:50]  BLOOD ADMINISTERED:none  DRAINS: Urinary Catheter (Foley)   LOCAL MEDICATIONS USED:  MARCAINE    and XYLOCAINE   SPECIMEN:  Source of Specimen:  uterus tubes and ovaries  DISPOSITION OF SPECIMEN:  PATHOLOGY  COUNTS:  YES  TOURNIQUET:  * No tourniquets in log *  DICTATION: .Other Dictation: Dictation Number [id number]  PLAN OF CARE: Admit for overnight observation  PATIENT DISPOSITION:  PACU - hemodynamically stable.   Delay start of Pharmacological VTE agent (>24hrs) due to surgical blood loss or risk of bleeding: no

## 2011-07-25 NOTE — Progress Notes (Signed)
Patient ID: [name], female   DOB: 06-Jun-1940, 71 y.o.   MRN: [id number] DOING WEEL AF VSS ABD SOFT INC CLEAR GOOD UO

## 2011-07-25 NOTE — Op Note (Signed)
[name], [name] NO.:  [id number]  MEDICAL RECORD NO.:  [id number]  LOCATION:  9309                          FACILITY:  [facility]  PHYSICIAN:  Juluis Mire, M.D.   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  OPERATIVE PROCEDURES: 1. Laparoscopic-assisted vaginal hysterectomy with bilateral salpingo-     oophorectomy. 2. Anterior colporrhaphy. 3. Mid urethral sling using a transobturator approach. 4. Cystoscopy. 5. Posterior colporrhaphy. 6. Sacrospinous ligament suspension.  SURGEON:  Juluis Mire, MD.  ASSISTANT:  Duke Salvia. Marcelle Overlie, MD.  ANESTHESIA:  General endotracheal.  ESTIMATED BLOOD LOSS:  300-400 mL.  PACKS:  Included vaginal pack.  DRAINS:  Urethral Foley.  INTRAOPERATIVE BLOOD PLACED:  None.  COMPLICATIONS:  None.  INDICATION:  Dictated in history and physical.  PROCEDURE IN DETAIL:  The patient was taken to the OR and placed in supine position.  After satisfactory level of general endotracheal anesthesia was obtained, the patient was placed in dorsal lithotomy position using the Allen stirrups.  The abdomen, perineum, and vagina prepped out with Betadine.  Bladder was drained out with catheterization.  A Hulka tenaculum was put in place and secured.  The patient was then draped in sterile field.  Subumbilical incision was made with a knife and carried through the subcutaneous tissue.  Fascia was entered sharply and incision was fashioned laterally.  Peritoneum was entered through finger pressure.  The open laparoscopic trocar was put in place and secured.  The abdomen was inflated with carbon dioxide. Laparoscope was introduced.  There was no  evidence of injury to adjacent organs.  Upper abdomen including liver, tip of the gallbladder, and both lateral gutters were cleared.  A 5-mm trocar was put in place in suprapubic area under direct visualization.  On visualization, she did have an adhesion from one of the amphorae of the sigmoid to the left adnexa.  This was taken down with the enseal.  First, the left ovary was elevated.  The ureters were easily seen coursing along the pelvic sidewall.  Using the enseal, the left ovarian vasculature was cauterized and incised.  The mesenteric attachments of the ovary and tubes were cauterized and incised up to the round ligament, that was then cauterized and incised.  We then went to the right side.  Right tube and ovary were elevated.  Again, the ureters could be seen coursing along the pelvic sidewall.  The right ovarian vasculature was cauterized and incised using the enseal.  The mesenteric attachments of the tubes and ovaries were cauterized and incised up to the round ligament.  The round ligament was then cauterized and incised.  At this point in time, decision was to go vaginally.  The abdomen was deflated with carbon dioxide.  The laparoscope had been removed.  The patient's legs were repositioned.  The Hulka tenaculum was then removed.  A weighted speculum was placed in the vaginal vault.  The cervix was  grasped with Christella Hartigan tenaculum.  Cul-de-sac was entered sharply.  Both uterosacral ligaments were clamped, cut, and suture ligated with 0 Vicryl.  The reflection of the vaginal mucosa anteriorly was incised using the Bovie.  Bladder was dissected superiorly. Paracervical tissue was clamped, cut, and suture ligated with 0 Vicryl. The vesicouterine space was entered sharply, retractors were put in place to retract the bladder superiorly.  Using the clamp, cut, and tie technique with suture ligature of 0 Vicryl, the parametrium serially was separated from sides of uterus.  The  uterus was then flipped.  Remaining pedicles were clamped and cut.  Uterus, tubes, and ovaries were passed off the operative field.  I had the pedicles secured with free ties of 0 Vicryl.  Uterosacral plication stitch of 0 Vicryl was put in place and secured.  Attention was now turned to the anterior repair.  The vaginal mucosa overlying the bladder was infiltrated with 1% Xylocaine with epinephrine.  An incision was made in the vaginal mucosa overlying the bladder using the knife.  Using sharp dissection, we separated the overlying vaginal mucosa from the underlying pubocervical fascia.  At this point in time, the cystocele was reduced with interrupted sutures of 2-0 Vicryl.  The vaginal edges were trimmed and reapproximated in the midline with interrupted sutures of 2-0 Vicryl.  The vaginal cuff was then closed with interrupted sutures of 2-0 Vicryl.  We had fairly good hemostasis and reduction of the cystocele.  Next, the suburethral area was infiltrated with 1% Xylocaine with epinephrine.  Incision was made in the vaginal mucosa.  Using blunt and sharp dissection, we dissected out laterally to the obturator foramen. An area was identified on the perineum at the level of the clitoris below the abductus longus muscle and lateral to the inferior pubic ramus, this was marked and infiltrated with 1% Xylocaine.  A punch incisions were then made.  Using the Halo transobturator system, both needles were brought into place, passed through the skin, through the obturator foramen around the inferior pubic ramus and out to the vaginal mucosa.  We did not buttonhole the vaginal mucosa.  The Foley had been inserted prior to this.  We have got about 100 mL that clamped at this point in time and was removed.  Cystoscopy was performed.  There was no evidence of injury to the bladder whatsoever.  Urethra was also intact. She had been given indigo carmine.  Jets of blue-tinged urine were noted to be  coming from both ureteral orifices.  The cystoscope was then removed.  We brought in the polypropylene mesh.  It was hooked and brought out through the skin incision.  The plastic taps were then trimmed.  We adjusted the mesh until it lay flat on the suburethral area, but we could easily rotate 90 degrees.  The plastic sheaths were then removed.  We readjusted the mesh so that was not too tight.  The arms of the mesh were then trimmed at the level of skin and this area was brought together with Dermabond.  The vaginal mucosa was then closed in a running suture of 2-0 Vicryl.  Attention was now turned to the rectocele.  The skin incision was made with a knife, and the skin was dissected up to the vaginal opening and excised.  The vaginal mucosa was then infiltrated with 1% Xylocaine with epinephrine.  We dissected the vaginal mucosa from the underlying perirectal fascia.  We then incised the vaginal mucosa in the midline. We continued the  dissection using blunt and sharp dissection until the vaginal mucosa was completely separated from the underlying rectum and sigmoid colon.  Next, we dissected out to the right side, identified the sacrospinous ligament, brought in the system with 0 Vicryl.  It was secured to the sacrospinous ligament that held.  At this point in time, the perirectal fascia was brought together in the midline with interrupted sutures of 2-0 Vicryl reducing the rectocele.  We secured the sacrospinous ligament suspension to the vaginal cuff.  We began reapproximating the vaginal mucosa.  Interrupted sutures of 2-0 Vicryl to about midway to the vaginal opening.  The sacrospinous ligament was then tied down with good elevation of the vaginal cuff.  Remaining vaginal mucosa was closed with interrupted sutures of 2-0 Vicryl.  We rebuilt the perineal body with 2-0 Vicryl and closed the skin over the perineal body with a running subcuticular of 2-0 Vicryl.  We had  good hemostasis, good approximation of the vaginal mucosa, and good elevation of the vaginal cuff.  Rectal exam was unremarkable.  Foley had been placed to straight drain at this point in time. Laparoscope was reintroduced.  The abdomen was reinflated with carbon dioxide.  We irrigated the pelvis.  She had some oozing of the vaginal cuff, brought under control with the bipolar, but sites of ovarian removal hemostatically intact.  We deflated the abdomen and reviewed these areas.  There was no active bleeding.  The abdomen was completely deflated of carbon dioxide.  The open laparoscopic trocar was removed. Subumbilical fascia was closed with figure-of-eight of 0 Vicryl.  The subumbilical skin was closed interrupted subcuticular of 4-0 Vicryl. Suprapubic incision was closed with Dermabond.  Vaginal pack was put in place.  The patient was taken out of the dorsal lithotomy position. Once alert and extubated, transferred to recovery in good condition. Sponge, instrument, and needle count reported as correct by the circulating nurse multiple times.  Urine output was blue tinged, but clear at the time of closure.  The patient was transferred to the recovery room in good condition.     Juluis Mire, M.D.     JSM/MEDQ  D:  07/25/2011  T:  07/25/2011  Job:  [id number]

## 2011-07-25 NOTE — H&P (Signed)
  History and physical exam unchanged 

## 2011-07-25 NOTE — Transfer of Care (Signed)
Immediate Anesthesia Transfer of Care Note  Patient: [name]  Procedure(s) Performed: Procedure(s) (LRB): LAPAROSCOPIC ASSISTED VAGINAL HYSTERECTOMY (N/A) SALPINGO OOPHERECTOMY (Bilateral) ANTERIOR (CYSTOCELE) AND POSTERIOR REPAIR (RECTOCELE) (N/A) TRANSVAGINAL TAPE (TVT) PROCEDURE (N/A) CYSTOSCOPY (N/A)  Patient Location: PACU  Anesthesia Type: General  Level of Consciousness: awake, alert  and oriented  Airway & Oxygen Therapy: Patient Spontanous Breathing and Patient connected to nasal cannula oxygen  Post-op Assessment: Report given to PACU RN and Post -op Vital signs reviewed and stable  Post vital signs: Reviewed and stable  Complications: No apparent anesthesia complications

## 2011-07-25 NOTE — Addendum Note (Signed)
Addendum  created 07/25/11 1410 by Mauricia Area, CRNA   Modules edited:Notes Section

## 2011-07-25 NOTE — Progress Notes (Signed)
Patient ID: [name], female   DOB: 04/15/1941, 71 y.o.   MRN: [id number] Patient name  [name] DICTATION# [id number] CSN# [id number]  Juluis Mire, MD 07/25/2011 9:45 AM

## 2011-07-26 ENCOUNTER — Encounter (HOSPITAL_COMMUNITY): Payer: Self-pay | Admitting: Obstetrics and Gynecology

## 2011-07-26 LAB — CBC
Hemoglobin: 12 g/dL (ref 12.0–15.0)
MCH: 30.5 pg (ref 26.0–34.0)
MCHC: 32.3 g/dL (ref 30.0–36.0)
RDW: 13 % (ref 11.5–15.5)

## 2011-07-26 MED ORDER — OXYCODONE-ACETAMINOPHEN 5-500 MG PO CAPS: 1.0000 | ORAL_CAPSULE | ORAL | Status: AC | PRN

## 2011-07-26 NOTE — Progress Notes (Signed)
Pt ambulated out foley intact teaching complete  Pt can care for foley written instructions  Given    No questions leg bag given also

## 2011-07-26 NOTE — Progress Notes (Signed)
1 Day Post-Op Procedure([name]) (LRB): LAPAROSCOPIC ASSISTED VAGINAL HYSTERECTOMY (N/A) SALPINGO OOPHERECTOMY (Bilateral) ANTERIOR (CYSTOCELE) AND POSTERIOR REPAIR (RECTOCELE) (N/A) TRANSVAGINAL TAPE (TVT) PROCEDURE (N/A) CYSTOSCOPY (N/A)  Subjective: Patient reports tolerating PO.    Objective: I have reviewed patient'[name] vital signs.  AF VSS ABD SOFT POSITIVE BOWEL SOUNDS INC CLEAR NO ACTIVE VAGINAL BLEEDING VOIDING TRIAL HGB 12  Assessment: [name]/p Procedure([name]) (LRB): LAPAROSCOPIC ASSISTED VAGINAL HYSTERECTOMY (N/A) SALPINGO OOPHERECTOMY (Bilateral) ANTERIOR (CYSTOCELE) AND POSTERIOR REPAIR (RECTOCELE) (N/A) TRANSVAGINAL TAPE (TVT) PROCEDURE (N/A) CYSTOSCOPY (N/A): stable  Plan: Discharge home  LOS: 1 day    Morgan [name] 07/26/2011, 8:34 AM

## 2011-07-26 NOTE — Discharge Instructions (Signed)
Hysterectomy Care After Refer to this sheet in the next few weeks. These instructions provide you with information on caring for yourself after your procedure. Your caregiver may also give you more specific instructions. Your treatment has been planned according to current medical practices, but problems sometimes occur. Call your caregiver if you have any problems or questions after your procedure. HOME CARE INSTRUCTIONS  Healing will take time. You may have discomfort, tenderness, swelling, and bruising at the surgical site for about 2 weeks. This is normal and will get better as time goes on.  Only take over-the-counter or prescription medicines for pain, discomfort, or fever as directed by your caregiver.   Do not take aspirin. It can cause bleeding.   Do not drive when taking pain medicine.   Follow your caregiver's advice regarding exercise, lifting, driving, and general activities.   Resume your usual diet as directed and allowed.   Get plenty of rest and sleep.   Do not douche, use tampons, or have sexual intercourse for at least 6 weeks or until your caregiver gives you permission.   Change your bandages (dressings) as directed by your caregiver.   Monitor your temperature.   Take showers instead of baths for 2 to 3 weeks.   Do not drink alcohol until your caregiver gives you permission.   If you are constipated, you may take a mild laxative with your caregiver's permission. Bran foods may help with constipation problems. Drinking enough fluids to keep your urine clear or pale yellow may help as well.   Try to have someone home with you for 1 or 2 weeks to help around the house.   Keep all of your follow-up appointments as directed by your caregiver.  SEEK MEDICAL CARE IF:   You have swelling, redness, or increasing pain in the surgical cut (incision) area.   You have pus coming from the incision.   You notice a bad smell coming from the incision or dressing.   You  have swelling, redness, or pain around the intravenous (IV) site.   Your incision breaks open.   You feel dizzy or lightheaded.   You have pain or bleeding when you urinate.   You have persistent diarrhea.   You have persistent nausea and vomiting.   You have abnormal vaginal discharge.   You have a rash.   You have any type of abnormal reaction or develop an allergy to your medicine.   Your pain is not controlled with your prescribed medicine.  SEEK IMMEDIATE MEDICAL CARE IF:   You have a fever.   You have severe abdominal pain.   You have chest pain.   You have shortness of breath.   You faint.   You have pain, swelling, or redness of your leg.   You have heavy vaginal bleeding with blood clots.  MAKE SURE YOU:  Understand these instructions.   Will watch your condition.   Will get help right away if you are not doing well or get worse.  Document Released: 11/25/2004 Document Revised: 04/27/2011 Document Reviewed: 12/23/2010 [facility] Patient Information 2012 [place], [place].Urethral Vaginal Sling A urethral vaginal sling can be done to correct urinary incontinence (UI). Urinary incontinence is uncontrolled loss of urine. It is very common in both women who have had children and in aging women. The purpose of the "urethral vaginal sling" is to place a strong piece of material (i.e., tension-free vaginal tape or nylon mesh) that fits under the urethra like a hammock. The urethra  is the tube that drains your bladder. This sling is put in position to straighten, support and hold the urethra in its normal position. This is a common surgical procedure for this problem. LET YOUR CAREGIVER KNOW ABOUT:   Any allergies to foods or medications.   All the medications you are taking. This includes over-the-counter drugs, herbs, eye drops, creams and steroids.   Use of illegal drugs.   Smoking or heavy alcohol drinking.   Past problems with anesthetics.   Possibility of  being pregnant.   History of blood clots or other blood problems.   History of bleeding problems.   Past surgery.   Other medical or health problems.  RISKS AND COMPLICATIONS   Infection.   Excessive bleeding.   Damage to other organs.   Problems urinating properly for several days or weeks.   Problems from the anesthesia.   The UI can come back.  BEFORE THE PROCEDURE   Do not take aspirin or blood thinners a week before the surgery unless you are told otherwise.   Do not eat or drink anything 8 hours before the surgery.   If you smoke, do not smoke at least two weeks before the surgery.   Let your caregiver know if you get a cold or infection before your surgery.   If you will be admitted the same day as the surgery, get to the hospital at least one hour before the surgery.   Arrange for someone to take you home from the hospital and for help at home.  AFTER THE PROCEDURE   You will be taken to recovery area where nurses will monitor your progress and vital signs (blood pressure, pulse, breathing, temperature). They will move you to your room when you are stable.   You will have a catheter in your bladder until you can urinate properly.   You may have a gauze packing in the vagina to prevent bleeding. This will be removed in one to two days.   You are usually in the hospital 2 to 3 days.  HOME CARE INSTRUCTIONS   Take showers instead of baths until you are informed otherwise.   Resume your usual diet.   Get rest and sleep.   Try to have someone at home for 2 to 3 weeks to help you with your household activities.   Do not lift anything over 5 pounds.   Only take over-the-counter or prescription medicines for pain, discomfort or fever as directed by your caregiver. Do not take aspirin because it can cause bleeding.   Do not douche, exercise, use tampons or have sexual intercourse until your caregiver gives you permission.  SEEK MEDICAL CARE IF:   You have a  heavy or bad smelling vaginal discharge.   You develop a rash.   You need stronger pain medication.   You are having side effects from your medications.   You develop lightheadedness or feel faint.  SEEK IMMEDIATE MEDICAL CARE IF:   You develop a temperature of 100 F (37.8 C) or higher.   You have vaginal bleeding.   You faint or pass out.   You develop shortness of breath.   You develop chest, abdominal or leg pain.   You develop pain when urinating or cannot urinate.   Your catheter is still in your bladder and it blocks up.   You develop swelling, redness and pain in the vaginal area.  Document Released: 02/15/2008 Document Revised: 04/27/2011 Document Reviewed: 02/15/2008 [employer] Patient Information 2012  [employer].Urethral Vaginal Sling A urethral vaginal sling can be done to correct urinary incontinence (UI). Urinary incontinence is uncontrolled loss of urine. It is very common in both women who have had children and in aging women. The purpose of the "urethral vaginal sling" is to place a strong piece of material (i.e., tension-free vaginal tape or nylon mesh) that fits under the urethra like a hammock. The urethra is the tube that drains your bladder. This sling is put in position to straighten, support and hold the urethra in its normal position. This is a common surgical procedure for this problem. LET YOUR CAREGIVER KNOW ABOUT:   Any allergies to foods or medications.   All the medications you are taking. This includes over-the-counter drugs, herbs, eye drops, creams and steroids.   Use of illegal drugs.   Smoking or heavy alcohol drinking.   Past problems with anesthetics.   Possibility of being pregnant.   History of blood clots or other blood problems.   History of bleeding problems.   Past surgery.   Other medical or health problems.  RISKS AND COMPLICATIONS   Infection.   Excessive bleeding.   Damage to other organs.   Problems  urinating properly for several days or weeks.   Problems from the anesthesia.   The UI can come back.  BEFORE THE PROCEDURE   Do not take aspirin or blood thinners a week before the surgery unless you are told otherwise.   Do not eat or drink anything 8 hours before the surgery.   If you smoke, do not smoke at least two weeks before the surgery.   Let your caregiver know if you get a cold or infection before your surgery.   If you will be admitted the same day as the surgery, get to the hospital at least one hour before the surgery.   Arrange for someone to take you home from the hospital and for help at home.  AFTER THE PROCEDURE   You will be taken to recovery area where nurses will monitor your progress and vital signs (blood pressure, pulse, breathing, temperature). They will move you to your room when you are stable.   You will have a catheter in your bladder until you can urinate properly.   You may have a gauze packing in the vagina to prevent bleeding. This will be removed in one to two days.   You are usually in the hospital 2 to 3 days.  HOME CARE INSTRUCTIONS   Take showers instead of baths until you are informed otherwise.   Resume your usual diet.   Get rest and sleep.   Try to have someone at home for 2 to 3 weeks to help you with your household activities.   Do not lift anything over 5 pounds.   Only take over-the-counter or prescription medicines for pain, discomfort or fever as directed by your caregiver. Do not take aspirin because it can cause bleeding.   Do not douche, exercise, use tampons or have sexual intercourse until your caregiver gives you permission.  SEEK MEDICAL CARE IF:   You have a heavy or bad smelling vaginal discharge.   You develop a rash.   You need stronger pain medication.   You are having side effects from your medications.   You develop lightheadedness or feel faint.  SEEK IMMEDIATE MEDICAL CARE IF:   You develop a  temperature of 100 F (37.8 C) or higher.   You have vaginal bleeding.  You faint or pass out.   You develop shortness of breath.   You develop chest, abdominal or leg pain.   You develop pain when urinating or cannot urinate.   Your catheter is still in your bladder and it blocks up.   You develop swelling, redness and pain in the vaginal area.  Document Released: 02/15/2008 Document Revised: 04/27/2011 Document Reviewed: 02/15/2008 [facility] Patient Information 2012 [place], [place].

## 2011-07-26 NOTE — Discharge Summary (Signed)
  Patient name  [name] DICTATION#   [id number] CSN# [id number]  Juluis Mire, MD 07/26/2011 8:38 AM

## 2011-07-27 NOTE — Discharge Summary (Signed)
[name], [name]                  ACCOUNT NO.:  [id number]  MEDICAL RECORD NO.:  [id number]  LOCATION:  9309                          FACILITY:  [facility]  PHYSICIAN:  Juluis Mire, M.D.   DATE OF BIRTH:  06-25-1940  DATE OF ADMISSION:  07/25/2011 DATE OF DISCHARGE:  07/26/2011                              DISCHARGE SUMMARY   ADMITTING DIAGNOSIS:  Pelvic relaxation with associated stress incontinence.  DISCHARGE DIAGNOSIS:  Pelvic relaxation with associated stress incontinence.  OPERATIVE PROCEDURES: 1. Laparoscopic-assisted vaginal hysterectomy. 2. Bilateral salpingo-oophorectomy. 3. Anterior and posterior colporrhaphy. 4. Midurethral sling. 5. Cystoscopy. 6. Sacrospinous ligament suspension.  For complete history and physical, please see dictated note.  COURSE IN THE HOSPITAL:  The patient underwent above-noted surgery.  She did extremely well, was tolerating a regular diet the next day and voiding without difficulty.  We instilled about 350 mL of fluid into her bladder, removed the Foley, she was able to void 150 mL.  Her hemoglobin was 12.  We are going to discharge her home after further voiding trials.  She is afebrile with stable vital signs.  Abdomen was soft. Bowel sounds were active.  All incisions were clear.  Vaginal pack had been discontinued and there was no active bleeding.  In terms of complications, none were encountered during stay in the hospital.  The patient was discharged home in stable condition.  DISPOSITION:  Again, we will complete voiding trial.  She has to go home with a Foley in place.  We will see her back and tomorrow, we will repeat voiding trial.  She was discharged home on Percocet, she needs for pain.  She was instructed to watch for active vaginal bleeding, any signs of infection in terms of fever, if she reports nausea or vomiting, if she reports increasing abdominopelvic pain.  Also, instructed signs and symptoms of deep venous  thrombosis and pulmonary embolus.  The patient voiced understanding of the above.     Juluis Mire, M.D.    JSM/MEDQ  D:  07/26/2011  T:  07/27/2011  Job:  [id number]

## 2012-07-10 ENCOUNTER — Other Ambulatory Visit: Payer: Self-pay | Admitting: Internal Medicine

## 2012-07-10 DIAGNOSIS — Z1231 Encounter for screening mammogram for malignant neoplasm of breast: Secondary | ICD-10-CM

## 2012-08-16 ENCOUNTER — Ambulatory Visit
Admission: RE | Admit: 2012-08-16 | Discharge: 2012-08-16 | Disposition: A | Discharge status: Home / Self Care | Payer: Medicare Other | Source: Ambulatory Visit | Attending: Internal Medicine | Admitting: Internal Medicine

## 2012-08-16 DIAGNOSIS — Z1231 Encounter for screening mammogram for malignant neoplasm of breast: Secondary | ICD-10-CM

## 2012-08-16 DIAGNOSIS — M8580 Other specified disorders of bone density and structure, unspecified site: Secondary | ICD-10-CM

## 2012-12-31 ENCOUNTER — Emergency Department (HOSPITAL_BASED_OUTPATIENT_CLINIC_OR_DEPARTMENT_OTHER)
Admission: EM | Admit: 2012-12-31 | Discharge: 2012-12-31 | Disposition: A | Discharge status: Home / Self Care | Payer: Medicare Other | Source: Home / Self Care | Attending: Emergency Medicine | Admitting: Emergency Medicine

## 2012-12-31 ENCOUNTER — Encounter (HOSPITAL_BASED_OUTPATIENT_CLINIC_OR_DEPARTMENT_OTHER): Payer: Self-pay

## 2012-12-31 ENCOUNTER — Emergency Department (HOSPITAL_BASED_OUTPATIENT_CLINIC_OR_DEPARTMENT_OTHER): Payer: Medicare Other

## 2012-12-31 ENCOUNTER — Emergency Department (HOSPITAL_BASED_OUTPATIENT_CLINIC_OR_DEPARTMENT_OTHER)
Admission: EM | Admit: 2012-12-31 | Discharge: 2012-12-31 | Disposition: A | Discharge status: Home / Self Care | Payer: Medicare Other | Attending: Emergency Medicine | Admitting: Emergency Medicine

## 2012-12-31 DIAGNOSIS — Z872 Personal history of diseases of the skin and subcutaneous tissue: Secondary | ICD-10-CM | POA: Insufficient documentation

## 2012-12-31 DIAGNOSIS — R141 Gas pain: Secondary | ICD-10-CM | POA: Insufficient documentation

## 2012-12-31 DIAGNOSIS — R630 Anorexia: Secondary | ICD-10-CM | POA: Insufficient documentation

## 2012-12-31 DIAGNOSIS — Z8719 Personal history of other diseases of the digestive system: Secondary | ICD-10-CM | POA: Insufficient documentation

## 2012-12-31 DIAGNOSIS — K5732 Diverticulitis of large intestine without perforation or abscess without bleeding: Secondary | ICD-10-CM | POA: Insufficient documentation

## 2012-12-31 DIAGNOSIS — R110 Nausea: Secondary | ICD-10-CM | POA: Insufficient documentation

## 2012-12-31 DIAGNOSIS — K5900 Constipation, unspecified: Secondary | ICD-10-CM

## 2012-12-31 DIAGNOSIS — Z90710 Acquired absence of both cervix and uterus: Secondary | ICD-10-CM | POA: Insufficient documentation

## 2012-12-31 DIAGNOSIS — R112 Nausea with vomiting, unspecified: Secondary | ICD-10-CM | POA: Insufficient documentation

## 2012-12-31 DIAGNOSIS — Z9851 Tubal ligation status: Secondary | ICD-10-CM | POA: Insufficient documentation

## 2012-12-31 DIAGNOSIS — R142 Eructation: Secondary | ICD-10-CM | POA: Insufficient documentation

## 2012-12-31 DIAGNOSIS — K5792 Diverticulitis of intestine, part unspecified, without perforation or abscess without bleeding: Secondary | ICD-10-CM

## 2012-12-31 HISTORY — DX: Cellulitis, unspecified: L03.90

## 2012-12-31 LAB — CBC WITH DIFFERENTIAL/PLATELET
Basophils Absolute: 0 10*3/uL (ref 0.0–0.1)
Eosinophils Absolute: 0.1 10*3/uL (ref 0.0–0.7)
Eosinophils Relative: 1 % (ref 0–5)
Lymphocytes Relative: 19 % (ref 12–46)
MCV: 94.8 fL (ref 78.0–100.0)
Neutrophils Relative %: 71 % (ref 43–77)
Platelets: 186 10*3/uL (ref 150–400)
RDW: 12.8 % (ref 11.5–15.5)
WBC: 8.6 10*3/uL (ref 4.0–10.5)

## 2012-12-31 LAB — URINALYSIS, ROUTINE W REFLEX MICROSCOPIC
Glucose, UA: NEGATIVE mg/dL
Hgb urine dipstick: NEGATIVE
Specific Gravity, Urine: 1.01 (ref 1.005–1.030)
pH: 6 (ref 5.0–8.0)

## 2012-12-31 LAB — COMPREHENSIVE METABOLIC PANEL
ALT: 23 U/L (ref 0–35)
AST: 22 U/L (ref 0–37)
Calcium: 9.9 mg/dL (ref 8.4–10.5)
Potassium: 4.2 mEq/L (ref 3.5–5.1)
Sodium: 141 mEq/L (ref 135–145)
Total Protein: 7.2 g/dL (ref 6.0–8.3)

## 2012-12-31 MED ORDER — ONDANSETRON HCL 4 MG/2ML IJ SOLN
4.0000 mg | Freq: Once | INTRAMUSCULAR | Status: AC
  Administered 2012-12-31: 4 mg via INTRAVENOUS
  Filled 2012-12-31: qty 2

## 2012-12-31 MED ORDER — ONDANSETRON HCL 4 MG PO TABS: 4.0000 mg | ORAL_TABLET | Freq: Four times a day (QID) | ORAL | Status: DC

## 2012-12-31 MED ORDER — METRONIDAZOLE 500 MG PO TABS: 500.0000 mg | ORAL_TABLET | Freq: Three times a day (TID) | ORAL | Status: DC

## 2012-12-31 MED ORDER — DOCUSATE SODIUM 100 MG PO CAPS: 100.0000 mg | ORAL_CAPSULE | Freq: Two times a day (BID) | ORAL | Status: DC

## 2012-12-31 MED ORDER — MORPHINE SULFATE 4 MG/ML IJ SOLN
2.0000 mg | Freq: Once | INTRAMUSCULAR | Status: AC
  Administered 2012-12-31: 2 mg via INTRAVENOUS
  Filled 2012-12-31: qty 1

## 2012-12-31 MED ORDER — MAGNESIUM CITRATE PO SOLN
1.0000 | Freq: Once | ORAL | Status: AC
  Administered 2012-12-31: 1 via ORAL
  Filled 2012-12-31: qty 296

## 2012-12-31 MED ORDER — SODIUM CHLORIDE 0.9 % IV BOLUS (SEPSIS)
1000.0000 mL | Freq: Once | INTRAVENOUS | Status: AC
  Administered 2012-12-31: 1000 mL via INTRAVENOUS

## 2012-12-31 MED ORDER — IOHEXOL 300 MG/ML  SOLN
100.0000 mL | Freq: Once | INTRAMUSCULAR | Status: AC | PRN
  Administered 2012-12-31: 100 mL via INTRAVENOUS

## 2012-12-31 MED ORDER — OXYCODONE-ACETAMINOPHEN 5-325 MG PO TABS: 2.0000 | ORAL_TABLET | ORAL | Status: DC | PRN

## 2012-12-31 MED ORDER — METRONIDAZOLE 500 MG PO TABS
500.0000 mg | ORAL_TABLET | Freq: Once | ORAL | Status: AC
Start: 2012-12-31 — End: 2012-12-31
  Administered 2012-12-31: 500 mg via ORAL
  Filled 2012-12-31: qty 1

## 2012-12-31 MED ORDER — MORPHINE SULFATE 4 MG/ML IJ SOLN
4.0000 mg | Freq: Once | INTRAMUSCULAR | Status: AC
  Administered 2012-12-31: 4 mg via INTRAVENOUS
  Filled 2012-12-31: qty 1

## 2012-12-31 MED ORDER — IOHEXOL 300 MG/ML  SOLN
50.0000 mL | Freq: Once | INTRAMUSCULAR | Status: AC | PRN
  Administered 2012-12-31: 50 mL via ORAL

## 2012-12-31 MED ORDER — CIPROFLOXACIN HCL 500 MG PO TABS: 500.0000 mg | ORAL_TABLET | Freq: Two times a day (BID) | ORAL | Status: DC

## 2012-12-31 MED ORDER — CIPROFLOXACIN HCL 500 MG PO TABS
500.0000 mg | ORAL_TABLET | Freq: Once | ORAL | Status: AC
  Administered 2012-12-31: 500 mg via ORAL
  Filled 2012-12-31: qty 1

## 2012-12-31 NOTE — ED Provider Notes (Signed)
CSN: [id number]     Arrival date & time 12/31/12  1736 History     First MD Initiated Contact with Patient 12/31/12 1805     Chief Complaint  Patient presents with  . Constipation  . Abdominal Pain   HPI  [name] is a 72 year old female with a PMH of GERD and cellulitis who presents to the ED for evaluation of abdominal pain and constipation.  Patient states that she was seen here earlier in the ED this morning around 9:00 for constipation and similar abdominal pain. Patient states that she had not had a bowel movement in the past 5 days. Her pain began last night after trying enemas and laxatives. She received x-rays and lab work in the ED and was told that the cause of her pain was likely due to constipation. She received mag citrate which he took at home.  Patient states that she became very nauseated and had 3 episodes of vomiting at home and one in the emergency department.  She states that her pain has not improved and is located diffusely throughout her abdomen. She describes her pain as an intermittent cramping sensation.  Nothing makes her pain better/worse.  She states that she had a small hard stool at home after taking mag citrate but had no relief of her pain.  She denies any dysuria, vaginal discharge, hematuria, hematochezia, rectal pain, fever, chills, chest pain, SOB, rhinorrhea, congestion, cough, headache, dizziness, lightheadedness, or leg edema.  She has a PMH positive for abdominal surgeries including laparotomy, tubal ligation, hysterectomy, anterior and posterior repair of cystocele and rectocele, bladder suspension procedure. No history of any heart problems in the past.     Past Medical History  Diagnosis Date  . Blood transfusion [facility]  . GERD (gastroesophageal reflux disease)     no RX  . Cellulitis    Past Surgical History  Procedure Laterality Date  . Laparotomy  1981  . Tubal ligation    . Laparoscopic assisted vaginal hysterectomy  07/25/2011     Procedure: LAPAROSCOPIC ASSISTED VAGINAL HYSTERECTOMY;  Surgeon: Juluis Mire, MD;  Location: [facility];  Service: Gynecology;  Laterality: N/A;  . Salpingoophorectomy  07/25/2011    Procedure: SALPINGO OOPHERECTOMY;  Surgeon: Juluis Mire, MD;  Location: [facility];  Service: Gynecology;  Laterality: Bilateral;  . Anterior and posterior repair  07/25/2011    Procedure: ANTERIOR (CYSTOCELE) AND POSTERIOR REPAIR (RECTOCELE);  Surgeon: Juluis Mire, MD;  Location: [facility];  Service: Gynecology;  Laterality: N/A;  with Sacrospinous Ligament Suspension  . Bladder suspension  07/25/2011    Procedure: TRANSVAGINAL TAPE (TVT) PROCEDURE;  Surgeon: Juluis Mire, MD;  Location: [facility];  Service: Gynecology;  Laterality: N/A;  Transobturator Sling  . Cystoscopy  07/25/2011    Procedure: CYSTOSCOPY;  Surgeon: Juluis Mire, MD;  Location: [facility];  Service: Gynecology;  Laterality: N/A;   No family history on file. History  Substance Use Topics  . Smoking status: Never Smoker   . Smokeless tobacco: Not on file  . Alcohol Use: Yes     Comment: socially   OB History   Grav Para Term Preterm Abortions TAB SAB Ect Mult Living                  Review of Systems  Constitutional: Negative for fever, chills, activity change, appetite change and fatigue.  HENT: Negative for congestion, sore throat, rhinorrhea, neck pain and neck stiffness.  Eyes: Negative for visual disturbance.  Respiratory: Negative for cough and shortness of breath.   Cardiovascular: Negative for chest pain, palpitations and leg swelling.  Gastrointestinal: Positive for nausea, vomiting, abdominal pain, constipation and abdominal distention. Negative for diarrhea, blood in stool, anal bleeding and rectal pain.  Genitourinary: Negative for dysuria, hematuria, decreased urine volume, vaginal bleeding, vaginal discharge, difficulty urinating and vaginal pain.  Musculoskeletal: Negative for back pain and gait problem.  Skin: Negative for rash and  wound.  Neurological: Negative for dizziness, syncope, weakness, light-headedness, numbness and headaches.     Allergies  Review of patient's allergies indicates no known allergies.  Home Medications   Current Outpatient Rx  Name  Route  Sig  Dispense  Refill  . magnesium citrate SOLN   Oral   Take 1 Bottle by mouth once.         . ondansetron (ZOFRAN) 4 MG tablet   Oral   Take 1 tablet (4 mg total) by mouth every 6 (six) hours. Take one prior to starting bowel cleanse and then q6hr prn   6 tablet   0    BP 148/77  Pulse 88  Temp(Src) 98.1 F (36.7 C) (Oral)  Resp 18  Ht 5\' 1"  (1.549 m)  Wt 133 lb (60.328 kg)  BMI 25.14 kg/m2  SpO2 99%  Filed Vitals:   12/31/12 1740 12/31/12 2220  BP: 148/77 159/91  Pulse: 88 81  Temp: 98.1 F (36.7 C)   TempSrc: Oral   Resp: 18 18  Height: 5\' 1"  (1.549 m)   Weight: 133 lb (60.328 kg)   SpO2: 99% 95%    Physical Exam  Nursing note and vitals reviewed. Constitutional: She is oriented to person, place, and time. She appears well-developed and well-nourished. No distress.  HENT:  Head: Normocephalic and atraumatic.  Right Ear: External ear normal.  Left Ear: External ear normal.  Nose: Nose normal.  Mouth/Throat: Oropharynx is clear and moist. No oropharyngeal exudate.  Eyes: Conjunctivae are normal. Pupils are equal, round, and reactive to light. Right eye exhibits no discharge. Left eye exhibits no discharge.  Neck: Normal range of motion. Neck supple.  Cardiovascular: Normal rate, regular rhythm, normal heart sounds and intact distal pulses.  Exam reveals no gallop and no friction rub.   No murmur heard. Pulmonary/Chest: Effort normal and breath sounds normal. No respiratory distress. She has no wheezes. She has no rales. She exhibits no tenderness.  Abdominal: Soft. Bowel sounds are normal. She exhibits distension. She exhibits no mass. There is tenderness. There is no rebound and no guarding.  Tenderness to palpation  throughout with no focal tenderness.  Abdomen is not rigid.  Mild distension throughout.    Genitourinary: Guaiac negative stool.  No external hemorrhoids or fissures. No palpable stool in the rectal vault. Minimal brown liquid stool expelled from rectal vault.  No gross blood.    Musculoskeletal: Normal range of motion. She exhibits no edema and no tenderness.  Neurological: She is alert and oriented to person, place, and time.  Skin: Skin is warm and dry. She is not diaphoretic.    ED Course   Procedures (including critical care time)  Labs Reviewed - No data to display Dg Abd 1 View  12/31/2012   *RADIOLOGY REPORT*  Clinical Data: Left lower quadrant abdominal pain.  Constipation.  ABDOMEN - 1 VIEW  Comparison: 09/30/2004  Findings: Prominence of stool throughout the colon suggests constipation.  There is 13 degrees of levoconvex scoliosis as measured between  T12-L5.  Rotary component noted.  No dilated small bowel or significant abnormal calcifications identified.  There is spurring of the pubic symphysis.  IMPRESSION:  1. Prominence of stool throughout the colon suggests constipation. 2.  Levoconvex lumbar scoliosis with rotary component.   Original Report Authenticated By: Gaylyn Rong, M.D.   1. Diverticulitis      Date: 12/31/2012  Rate: 83  Rhythm: normal sinus rhythm  QRS Axis: normal  Intervals: normal  ST/T Wave abnormalities: normal  Conduction Disutrbances:none  Narrative Interpretation:   Old EKG Reviewed: unchanged (07/21/11)   Results for orders placed during the hospital encounter of 12/31/12  OCCULT BLOOD X 1 CARD TO LAB, STOOL      Result Value Range   Fecal Occult Bld NEGATIVE  NEGATIVE  TROPONIN I      Result Value Range   Troponin I <0.30  <0.30 ng/mL       CT Abdomen Pelvis W Contrast (Final result)  Result time: 12/31/12 21:16:09    Final result by Rad Results In Interface (12/31/12 21:16:09)    Narrative:   *RADIOLOGY REPORT*  Clinical Data:  Constipation. Last normal bowel movement 5 days ago. Abdominal distention, nausea diffuse abdominal pain.  CT ABDOMEN AND PELVIS WITH CONTRAST  Technique: Multidetector CT imaging of the abdomen and pelvis was performed following the standard protocol during bolus administration of intravenous contrast.  Contrast: 50mL OMNIPAQUE IOHEXOL 300 MG/ML SOLN, OMNIPAQUE IOHEXOL 300 MG/ML SOLN  Comparison: Current abdomen radiographs  Findings: There are multiple sigmoid colon diverticula. Subtle hazy opacitiy is seen in the mesenteric fat adjacent to the sigmoid colon. This is equivocal and may be chronic. A minimal diverticulitis is possible. There is no bowel mass evident.  There is no increase stool. The colon is mostly fluid filled with air fluid levels in the transverse right colon. There is no bowel wall thickening. The small bowel is normal in caliber. A normal appendix is visualized.  Coarse reticular opacities are noted at the lung bases consistent with a combination of subsegmental atelectasis and scarring. Heart is normal in size. There is a moderate hiatal hernia.  The liver shows diffuse fatty infiltration. No liver mass or focal lesion is seen.  Normal spleen, gallbladder pancreas. No bile duct dilation. No adrenal masses.  There is a small fat density lesion arising from the lower pole of the left kidney likely an angiomyolipoma. The kidneys otherwise unremarkable. Normal ureters. Normal bladder.  The uterus is surgically absent. There are no pelvic masses.  No adenopathy. There are no abnormal fluid collections. There are degenerative changes of the visualized spine with a grade 1 anterolisthesis of L5 on S1. No osteolytic lesions.  IMPRESSION: New there is no constipation. The colon is fluid-filled. There are multiple sigmoid colon diverticula. Subtle hazy opacity in the pericolonic fat along the sigmoid colon may be chronic, but could reflect a minimal  diverticulitis. Diverticulitis should be considered likely if this patient have left lower quadrant pain.  No other evidence of acute abnormality. A normal appendix is visualized.  Diffuse fatty infiltration of the liver. There are degenerative changes of the visualized spine. Small left renal angiomyolipoma. Moderate hiatal hernia.   Original Report Authenticated By: Amie Portland, M.D.    Labs from previous visit earlier this morning   CBC    Component Value Date/Time   WBC 8.6 12/31/2012 0915   RBC 4.81 12/31/2012 0915   HGB 15.4* 12/31/2012 0915   HCT 45.6 12/31/2012 0915   PLT  186 12/31/2012 0915   MCV 94.8 12/31/2012 0915   MCH 32.0 12/31/2012 0915   MCHC 33.8 12/31/2012 0915   RDW 12.8 12/31/2012 0915   LYMPHSABS 1.6 12/31/2012 0915   MONOABS 0.8 12/31/2012 0915   EOSABS 0.1 12/31/2012 0915   BASOSABS 0.0 12/31/2012 0915   CMP     Component Value Date/Time   NA 141 12/31/2012 0915   K 4.2 12/31/2012 0915   CL 104 12/31/2012 0915   CO2 26 12/31/2012 0915   GLUCOSE 122* 12/31/2012 0915   BUN 11 12/31/2012 0915   CREATININE 0.90 12/31/2012 0915   CALCIUM 9.9 12/31/2012 0915   PROT 7.2 12/31/2012 0915   ALBUMIN 4.0 12/31/2012 0915   AST 22 12/31/2012 0915   ALT 23 12/31/2012 0915   ALKPHOS 80 12/31/2012 0915   BILITOT 0.4 12/31/2012 0915   GFRNONAA 63* 12/31/2012 0915   GFRAA 73* 12/31/2012 0915   Urinalysis    Component Value Date/Time   COLORURINE YELLOW 12/31/2012 0925   APPEARANCEUR CLEAR 12/31/2012 0925   LABSPEC 1.010 12/31/2012 0925   PHURINE 6.0 12/31/2012 0925   GLUCOSEU NEGATIVE 12/31/2012 0925   HGBUR NEGATIVE 12/31/2012 0925   BILIRUBINUR NEGATIVE 12/31/2012 0925   KETONESUR NEGATIVE 12/31/2012 0925   PROTEINUR NEGATIVE 12/31/2012 0925   UROBILINOGEN 0.2 12/31/2012 0925   NITRITE NEGATIVE 12/31/2012 0925   LEUKOCYTESUR NEGATIVE 12/31/2012 0925    MDM  Avarie Tavano is a 72 year old female with a PMH of GERD and cellulitis who presents to the ED for evaluation of abdominal  pain and constipation.  Previous labs reviewed.  Troponin and EKG ordered due to possible atypical signs of MI.  No chest pain or SOB.  Zofran ordered for nausea.  1L normal saline ordered.  2 mg morphine ordered for pain.       Rechecks  7:15 PM = Patient has had no improvements in pain.  Feels better laying on her left side.  Rectal exam performed.  No palpable stool.  Occult blood stool sent to lab.  CT abdomen and pelvis ordered to further evaluate.  Patient does not want pain medications at this time.   9:00 PM = Pain increased.  Ordering 4 mg morphine  9:45 PM = Pain improved.  Cipro and Flagyl ordered PO to see if patient will tolerate the medication.  QTc 404 10:15 PM = Patient nauseated again.  4 mg Zofran ordered.   10:33 PM = Patient states she feels much better.  Getting dressed and has to use the restroom.  Patient states she feels good enough to be discharged home.      Etiology of abdominal pain likely due to diverticulitis.  CT scan showed multiple sigmoid colon diverticula with evidence of diverticulitis.  Previous labs done today showed no evidence of leukocytosis and she remained afebrile emergency department.  EKG was negative for any acute ischemic changes and her troponin was negative. She had no complaints of chest pain or shortness of breath throughout her ED visit. Patient's pain improved throughout her ED visit with IV morphine. Her nausea improved with Zofran. Patient's overall condition improved and she felt stable enough for discharge.  Patient was prescribed Percocet for outpatient pain management as well as Colace stool softener for constipation. Patient was encouraged to drink plenty of water and follow a clear liquid diet for at least 24 hours.  Patient was also given a prescription for Zofran.  She had already received a prescription for Zofran at her earlier  visit this morning however she only had 4 Zofran tablets left. Patient was also prescribed ciprofloxacin and  Flagyl.  She was instructed to followup with her primary care provider sometime this week.  Patient was encouraged to return to the emergency department if she has repeated emesis, fever, worsening or change in her abdominal pain, or other concerns. Patient states she will call him tomorrow to set up an appointment. Patient was driven home by her husband who is also present in the emergency department. Patient was in agreement with discharge plan.    Final impressions: 1. Diverticulitis     Greer Ee Zeynab Klett PA-C    Jillyn Ledger, PA-C 12/31/12 2306

## 2012-12-31 NOTE — ED Notes (Signed)
Pt seen here earlier, had xray which revealed constipation but no obstruction.  Has been taking mag citrate as ordered, is now more uncomfortable and has been vomiting.  Reports minimal success with mag citrate.

## 2012-12-31 NOTE — ED Notes (Signed)
Pt reports has had constipation recently, last normal bm was about 5 days ago, has had abd distention, nausea and diffuse abd pain.

## 2012-12-31 NOTE — ED Notes (Signed)
Has not been able to keep anything down. Minimal emesis noted in the emesis bag. Bowel sound present.

## 2012-12-31 NOTE — ED Notes (Signed)
MD at bedside to discuss results of testing. 

## 2012-12-31 NOTE — ED Notes (Signed)
Pt returned from radiology now 

## 2012-12-31 NOTE — ED Provider Notes (Addendum)
CSN: [id number]     Arrival date & time 12/31/12  0845 History     First MD Initiated Contact with Patient 12/31/12 [id number]     Chief Complaint  Patient presents with  . Constipation   (Consider location/radiation/quality/duration/timing/severity/associated sxs/prior Treatment) Patient is a 72 y.o. female presenting with constipation. The history is provided by the patient.  Constipation Severity:  Severe Time since last bowel movement:  5 days Timing:  Constant Progression:  Worsening Chronicity:  New Context: not dehydration, not dietary changes, not medication and not narcotics   Stool description:  Hard Unusual stool frequency:  Usually has regular bowel movements but since thursday has not had a normal BM and small amt of stool she has passed has been hard Relieved by:  Nothing Worsened by:  Nothing tried Ineffective treatments:  Enemas and laxatives Associated symptoms: anorexia and nausea   Associated symptoms: no back pain, no diarrhea, no dysuria, no fever and no vomiting   Associated symptoms comment:  Abdominal cramping that comes and goes since taking laxative last night Risk factors: hx of abdominal surgery   Risk factors: no change in medication, no recent antibiotic use, no recent illness, no recent surgery and no recent travel     Past Medical History  Diagnosis Date  . [facility]  . GERD (gastroesophageal reflux disease)     no RX  . Cellulitis    Past Surgical History  Procedure Laterality Date  . Laparotomy  1981  . Tubal ligation    . Laparoscopic assisted vaginal hysterectomy  07/25/2011    Procedure: LAPAROSCOPIC ASSISTED VAGINAL HYSTERECTOMY;  Surgeon: Juluis Mire, MD;  Location: [facility];  Service: Gynecology;  Laterality: N/A;  . Salpingoophorectomy  07/25/2011    Procedure: SALPINGO OOPHERECTOMY;  Surgeon: Juluis Mire, MD;  Location: [facility];  Service: Gynecology;  Laterality: Bilateral;  . Anterior and posterior repair   07/25/2011    Procedure: ANTERIOR (CYSTOCELE) AND POSTERIOR REPAIR (RECTOCELE);  Surgeon: Juluis Mire, MD;  Location: [facility];  Service: Gynecology;  Laterality: N/A;  with Sacrospinous Ligament Suspension  . Bladder suspension  07/25/2011    Procedure: TRANSVAGINAL TAPE (TVT) PROCEDURE;  Surgeon: Juluis Mire, MD;  Location: [facility];  Service: Gynecology;  Laterality: N/A;  Transobturator Sling  . Cystoscopy  07/25/2011    Procedure: CYSTOSCOPY;  Surgeon: Juluis Mire, MD;  Location: [facility];  Service: Gynecology;  Laterality: N/A;   No family history on file. History  Substance Use Topics  . Smoking status: Never Smoker   . Smokeless tobacco: Not on file  . Alcohol Use: Yes     Comment: socially   OB History   Grav Para Term Preterm Abortions TAB SAB Ect Mult Living                 Review of Systems  Constitutional: Negative for fever.  Gastrointestinal: Positive for nausea, constipation and anorexia. Negative for vomiting and diarrhea.  Genitourinary: Negative for dysuria.  Musculoskeletal: Negative for back pain.  All other systems reviewed and are negative.    Allergies  Review of patient's allergies indicates no known allergies.  Home Medications  No current outpatient prescriptions on file. BP 156/83  Pulse 74  Temp(Src) 97.6 F (36.4 C) (Oral)  Resp 16  Ht 5\' 1"  (1.549 m)  Wt 133 lb (60.328 kg)  BMI 25.14 kg/m2  SpO2 100% Physical Exam  Nursing note and vitals reviewed. Constitutional: She  is oriented to person, place, and time. She appears well-developed and well-nourished. No distress.  HENT:  Head: Normocephalic and atraumatic.  Mouth/Throat: Oropharynx is clear and moist.  Eyes: Conjunctivae and EOM are normal. Pupils are equal, round, and reactive to light.  Neck: Normal range of motion. Neck supple.  Cardiovascular: Normal rate, regular rhythm and intact distal pulses.   No murmur heard. Pulmonary/Chest: Effort normal and breath sounds normal. No  respiratory distress. She has no wheezes. She has no rales.  Abdominal: Soft. She exhibits distension. Bowel sounds are decreased. There is generalized tenderness. There is no rebound, no guarding and no CVA tenderness. No hernia.  Musculoskeletal: Normal range of motion. She exhibits no edema and no tenderness.  Neurological: She is alert and oriented to person, place, and time.  Skin: Skin is warm and dry. No rash noted. No erythema.  Psychiatric: She has a normal mood and affect. Her behavior is normal.    ED Course   Procedures (including critical care time)  Labs Reviewed  CBC WITH DIFFERENTIAL - Abnormal; Notable for the following:    Hemoglobin 15.4 (*)    All other components within normal limits  COMPREHENSIVE METABOLIC PANEL - Abnormal; Notable for the following:    Glucose, Bld 122 (*)    GFR calc non Af Amer 63 (*)    GFR calc Af Amer 73 (*)    All other components within normal limits  LIPASE, BLOOD  URINALYSIS, ROUTINE W REFLEX MICROSCOPIC   Dg Abd 1 View  12/31/2012   *RADIOLOGY REPORT*  Clinical Data: Left lower quadrant abdominal pain.  Constipation.  ABDOMEN - 1 VIEW  Comparison: 09/30/2004  Findings: Prominence of stool throughout the colon suggests constipation.  There is 13 degrees of levoconvex scoliosis as measured between T12-L5.  Rotary component noted.  No dilated small bowel or significant abnormal calcifications identified.  There is spurring of the pubic symphysis.  IMPRESSION:  1. Prominence of stool throughout the colon suggests constipation. 2.  Levoconvex lumbar scoliosis with rotary component.   Original Report Authenticated By: Gaylyn Rong, M.D.   1. Constipation     MDM   Patient presents do to abdominal distention, nausea and intermittent or abdominal cramps. She states her last normal bowel movement was on Thursday despite taking Dulcolax and attempting an enema today she has been unable to have a significant bowel movement. She denies  feeling stool in her rectum and denies any recent medications, diet changes or travel. Patient has had colonoscopies in the past several normal and on exam she has mild diffuse tenderness but no localized pain. She is well appearing with normal vital signs. Prior abdominal surgeries have all been GU in nature.  Low suspicion for perforated viscus I feel this could all be due to constipation.  However given age and nature of complaints CBC, CMP, lipase, UA pending. KUB pending  10:06 AM Labs wnl.  KUB with large stool burden which explains pt sx.  10:16 AM Pt offered an enema or oral meds and she chose oral meds.  Gwyneth Sprout, MD 12/31/12 1006  Gwyneth Sprout, MD 12/31/12 1016

## 2012-12-31 NOTE — ED Notes (Signed)
Pt notified of need for urine sample. 

## 2012-12-31 NOTE — ED Notes (Signed)
MD at bedside. 

## 2012-12-31 NOTE — ED Notes (Signed)
Patient transported to X-ray ambulatory with tech. 

## 2012-12-31 NOTE — ED Notes (Signed)
Pt back from x-ray.

## 2013-01-01 NOTE — ED Provider Notes (Signed)
Medical screening examination/treatment/procedure(s) were conducted as a shared visit with non-physician practitioner(s) and myself.  I personally evaluated the patient during the encounter: CT shows early diverticulitis. Patient able to take po abx in the ED w/o vomiting, and pain is controlled. Feel she is safe for trial of outpatient therapy with close PCP f/u.  Audree Camel, MD 01/01/13 [phone]

## 2013-01-07 ENCOUNTER — Telehealth (HOSPITAL_COMMUNITY): Payer: Self-pay | Admitting: Emergency Medicine

## 2013-02-19 ENCOUNTER — Ambulatory Visit (INDEPENDENT_AMBULATORY_CARE_PROVIDER_SITE_OTHER): Payer: Medicare Other | Admitting: Family Medicine

## 2013-02-19 VITALS — BP 118/84 | HR 70 | Temp 98.0°F | Resp 16 | Ht 61.0 in | Wt 132.0 lb

## 2013-02-19 DIAGNOSIS — R21 Rash and other nonspecific skin eruption: Secondary | ICD-10-CM

## 2013-02-19 LAB — POCT SKIN KOH: Skin KOH, POC: NEGATIVE

## 2013-02-19 MED ORDER — TRIAMCINOLONE ACETONIDE 0.1 % EX OINT: TOPICAL_OINTMENT | Freq: Three times a day (TID) | CUTANEOUS | Status: DC

## 2013-02-19 MED ORDER — HYDROXYZINE HCL 25 MG PO TABS: 25.0000 mg | ORAL_TABLET | Freq: Every evening | ORAL | Status: DC | PRN

## 2013-02-19 NOTE — Patient Instructions (Signed)
Thank you for coming in today  Use topical steroid on rash 3x per day and decrease as you improve Try to identify any trigger Moisturize 3x per day Everything fragrance free. Quick showers with lukewarm water Followup if you dont improve, worsen, etc

## 2013-02-19 NOTE — Progress Notes (Signed)
Patient discussed with Dr. Voss. Agree with assessment and plan of care per her note.   

## 2013-02-19 NOTE — Progress Notes (Signed)
  Subjective:    Patient ID: [name], female    DOB: 04/15/1941, 72 y.o.   MRN: [id number]  HPI Patient presents with rash on arms. Started as small patch on arm and leg. Thought it was dry skin. Then spread to both arms, breast, and stomach. No new detergent, dry cleaner, soap. Is under a lot of stress. Very itchy, not painful. Many years ago had a rash from [employer] that was similar. No new meds. No new foods. Recently had diverticulitis in August but not on any more meds. No other house members with similar rash. No pets. No recent travel. No systemic symptoms or feeling ill.  Review of Systems  All other systems reviewed and are negative.       Objective:   Physical Exam  Constitutional: She is oriented to person, place, and time. She appears well-developed and well-nourished.  HENT:  Head: Normocephalic and atraumatic.  Cardiovascular: Normal rate.   Pulmonary/Chest: Effort normal.  Musculoskeletal: Normal range of motion.  Neurological: She is alert and oriented to person, place, and time.  Psychiatric: She has a normal mood and affect. Her behavior is normal.  Skin: There is an erythematous maculopapular rash on the bilateral arm, abdomen, and erythematous patch on the breasts bilaterally.     Assessment & Plan:  #1. Pruritic Rash - Suspect allergic/contact - Triamcinolone - Hydroxyzine qhs  - Moisturize - F/u if persistent or worsen

## 2014-02-23 ENCOUNTER — Other Ambulatory Visit: Payer: Self-pay

## 2014-02-23 DIAGNOSIS — Z1239 Encounter for other screening for malignant neoplasm of breast: Secondary | ICD-10-CM

## 2014-03-05 ENCOUNTER — Ambulatory Visit
Admission: RE | Admit: 2014-03-05 | Discharge: 2014-03-05 | Disposition: A | Discharge status: Home / Self Care | Payer: Medicare Other | Source: Ambulatory Visit

## 2014-03-05 DIAGNOSIS — Z1239 Encounter for other screening for malignant neoplasm of breast: Secondary | ICD-10-CM

## 2014-07-24 DIAGNOSIS — D559 Anemia due to enzyme disorder, unspecified: Secondary | ICD-10-CM | POA: Diagnosis not present

## 2014-07-24 DIAGNOSIS — E780 Pure hypercholesterolemia: Secondary | ICD-10-CM | POA: Diagnosis not present

## 2014-07-24 DIAGNOSIS — Z0000 Encounter for general adult medical examination without abnormal findings: Secondary | ICD-10-CM | POA: Diagnosis not present

## 2014-09-24 DIAGNOSIS — Z23 Encounter for immunization: Secondary | ICD-10-CM | POA: Diagnosis not present

## 2015-02-03 DIAGNOSIS — H524 Presbyopia: Secondary | ICD-10-CM | POA: Diagnosis not present

## 2015-02-03 DIAGNOSIS — H5203 Hypermetropia, bilateral: Secondary | ICD-10-CM | POA: Diagnosis not present

## 2015-02-03 DIAGNOSIS — H53022 Refractive amblyopia, left eye: Secondary | ICD-10-CM | POA: Diagnosis not present

## 2015-02-15 DIAGNOSIS — Z23 Encounter for immunization: Secondary | ICD-10-CM | POA: Diagnosis not present

## 2015-06-21 ENCOUNTER — Other Ambulatory Visit: Payer: Self-pay

## 2015-06-21 DIAGNOSIS — Z1231 Encounter for screening mammogram for malignant neoplasm of breast: Secondary | ICD-10-CM

## 2015-06-25 ENCOUNTER — Encounter: Payer: Self-pay | Admitting: Internal Medicine

## 2015-07-08 ENCOUNTER — Ambulatory Visit
Admission: RE | Admit: 2015-07-08 | Discharge: 2015-07-08 | Disposition: A | Discharge status: Home / Self Care | Payer: Medicare Other | Source: Ambulatory Visit

## 2015-07-08 DIAGNOSIS — Z1231 Encounter for screening mammogram for malignant neoplasm of breast: Secondary | ICD-10-CM | POA: Diagnosis not present

## 2015-07-28 DIAGNOSIS — Z0000 Encounter for general adult medical examination without abnormal findings: Secondary | ICD-10-CM | POA: Diagnosis not present

## 2015-08-16 ENCOUNTER — Ambulatory Visit (AMBULATORY_SURGERY_CENTER): Payer: Self-pay | Admitting: *Deleted

## 2015-08-16 VITALS — Ht 61.0 in | Wt 137.2 lb

## 2015-08-16 DIAGNOSIS — Z1211 Encounter for screening for malignant neoplasm of colon: Secondary | ICD-10-CM

## 2015-08-16 MED ORDER — NA SULFATE-K SULFATE-MG SULF 17.5-3.13-1.6 GM/177ML PO SOLN: 1.0000 | Freq: Once | ORAL | Status: DC

## 2015-08-16 NOTE — Progress Notes (Signed)
No egg or soy allergy known to patient  No issues with past sedation with any surgeries  or procedures, no intubation problems  No diet pills per patient No home 02 use per patient  No blood thinners per patient  Pt denies issues with constipation  emmi declined

## 2015-08-17 DIAGNOSIS — D224 Melanocytic nevi of scalp and neck: Secondary | ICD-10-CM | POA: Diagnosis not present

## 2015-08-26 ENCOUNTER — Telehealth: Payer: Self-pay | Admitting: Internal Medicine

## 2015-08-26 DIAGNOSIS — Z1211 Encounter for screening for malignant neoplasm of colon: Secondary | ICD-10-CM

## 2015-08-26 MED ORDER — NA SULFATE-K SULFATE-MG SULF 17.5-3.13-1.6 GM/177ML PO SOLN: 1.0000 | Freq: Once | ORAL | Status: DC

## 2015-08-26 NOTE — Telephone Encounter (Signed)
Prescription re-sent to pharmacy.

## 2015-08-30 ENCOUNTER — Encounter: Payer: Self-pay | Admitting: Internal Medicine

## 2015-09-08 ENCOUNTER — Encounter: Payer: Self-pay | Admitting: Internal Medicine

## 2015-09-08 ENCOUNTER — Ambulatory Visit (AMBULATORY_SURGERY_CENTER): Payer: Medicare Other | Admitting: Internal Medicine

## 2015-09-08 VITALS — BP 125/75 | HR 59 | Temp 97.8°F | Resp 17 | Ht 61.0 in | Wt 132.0 lb

## 2015-09-08 DIAGNOSIS — Z1211 Encounter for screening for malignant neoplasm of colon: Secondary | ICD-10-CM | POA: Diagnosis present

## 2015-09-08 MED ORDER — SODIUM CHLORIDE 0.9 % IV SOLN: 500.0000 mL | INTRAVENOUS | Status: DC

## 2015-09-08 NOTE — Op Note (Signed)
[facility] Patient Name: Koryn Fretz Procedure Date: 09/08/2015 8:04 AM MRN: [id number] Endoscopist: Docia Chuck. Henrene Pastor , MD Age: 75 Date of Birth: 04/16/1941 Gender: Female Procedure:                Colonoscopy Indications:              Screening for colorectal malignant neoplasm. Prior                            screening examination 2004 negative for neoplasia Medicines:                Monitored Anesthesia Care Procedure:                Pre-Anesthesia Assessment:                           - Prior to the procedure, a History and Physical                            was performed, and patient medications and                            allergies were reviewed. The patient's tolerance of                            previous anesthesia was also reviewed. The risks                            and benefits of the procedure and the sedation                            options and risks were discussed with the patient.                            All questions were answered, and informed consent                            was obtained. Prior Anticoagulants: The patient has                            taken no previous anticoagulant or antiplatelet                            agents. ASA Grade Assessment: II - A patient with                            mild systemic disease. After reviewing the risks                            and benefits, the patient was deemed in                            satisfactory condition to undergo the procedure.  After obtaining informed consent, the colonoscope                            was passed under direct vision. Throughout the                            procedure, the patient's blood pressure, pulse, and                            oxygen saturations were monitored continuously. The                            Model CF-HQ190L [id number] scope was introduced                            through the anus and advanced to the the cecum,                           identified by appendiceal orifice and ileocecal                            valve. The ileocecal valve, appendiceal orifice,                            and rectum were photographed. The quality of the                            bowel preparation was excellent. The colonoscopy                            was performed without difficulty. The patient                            tolerated the procedure well. The bowel preparation                            used was SUPREP. Scope In: 8:49:01 AM Scope Out: 9:01:09 AM Scope Withdrawal Time: 0 hours 9 minutes 6 seconds  Total Procedure Duration: 0 hours 12 minutes 8 seconds  Findings:                 Multiple medium-mouthed diverticula were found in                            the sigmoid colon.                           Internal hemorrhoids were found during retroflexion.                           The exam was otherwise without abnormality on                            direct and retroflexion views. Complications:            No immediate complications. Estimated blood  loss:                            None. Estimated Blood Loss:     Estimated blood loss: none. Impression:               - Diverticulosis in the sigmoid colon.                           - Internal hemorrhoids.                           - The examination was otherwise normal on direct                            and retroflexion views.                           - No specimens collected. Recommendation:           - Patient has a contact number available for                            emergencies. The signs and symptoms of potential                            delayed complications were discussed with the                            patient. Return to normal activities tomorrow.                            Written discharge instructions were provided to the                            patient.                           - Resume previous diet.                            - Continue present medications.                           - No repeat routine screening colonoscopy due to                            age. Docia Chuck. Henrene Pastor, MD 09/08/2015 9:11:00 AM This report has been signed electronically. CC Letter to:             Lance Muss, MD

## 2015-09-08 NOTE — Progress Notes (Signed)
Patient awakening,vss,report to rn 

## 2015-09-08 NOTE — Patient Instructions (Signed)
YOU HAD AN ENDOSCOPIC PROCEDURE TODAY AT THE [facility]:   Refer to the procedure report that was given to you for any specific questions about what was found during the examination.  If the procedure report does not answer your questions, please call your gastroenterologist to clarify.  If you requested that your care partner not be given the details of your procedure findings, then the procedure report has been included in a sealed envelope for you to review at your convenience later.  YOU SHOULD EXPECT: Some feelings of bloating in the abdomen. Passage of more gas than usual.  Walking can help get rid of the air that was put into your GI tract during the procedure and reduce the bloating. If you had a lower endoscopy (such as a colonoscopy or flexible sigmoidoscopy) you may notice spotting of blood in your stool or on the toilet paper. If you underwent a bowel prep for your procedure, you may not have a normal bowel movement for a few days.  Please Note:  You might notice some irritation and congestion in your nose or some drainage.  This is from the oxygen used during your procedure.  There is no need for concern and it should clear up in a day or so.  SYMPTOMS TO REPORT IMMEDIATELY:   Following lower endoscopy (colonoscopy or flexible sigmoidoscopy):  Excessive amounts of blood in the stool  Significant tenderness or worsening of abdominal pains  Swelling of the abdomen that is new, acute  Fever of 100F or higher   Following upper endoscopy (EGD)  Vomiting of blood or coffee ground material  New chest pain or pain under the shoulder blades  Painful or persistently difficult swallowing  New shortness of breath  Fever of 100F or higher  Black, tarry-looking stools  For urgent or emergent issues, a gastroenterologist can be reached at any hour by calling [phone].   DIET: Your first meal following the procedure should be a small meal and then it is ok to progress to  your normal diet. Heavy or fried foods are harder to digest and may make you feel nauseous or bloated.  Likewise, meals heavy in dairy and vegetables can increase bloating.  Drink plenty of fluids but you should avoid alcoholic beverages for 24 hours.  ACTIVITY:  You should plan to take it easy for the rest of today and you should NOT DRIVE or use heavy machinery until tomorrow (because of the sedation medicines used during the test).    FOLLOW UP: Our staff will call the number listed on your records the next business day following your procedure to check on you and address any questions or concerns that you may have regarding the information given to you following your procedure. If we do not reach you, we will leave a message.  However, if you are feeling well and you are not experiencing any problems, there is no need to return our call.  We will assume that you have returned to your regular daily activities without incident.  If any biopsies were taken you will be contacted by phone or by letter within the next 1-3 weeks.  Please call us at [phone] if you have not heard about the biopsies in 3 weeks.    SIGNATURES/CONFIDENTIALITY: You and/or your care partner have signed paperwork which will be entered into your electronic medical record.  These signatures attest to the fact that that the information above on your After Visit Summary has been reviewed   and is understood.  Full responsibility of the confidentiality of this discharge information lies with you and/or your care-partner.YOU HAD AN ENDOSCOPIC PROCEDURE TODAY AT [facility]:   Refer to the procedure report that was given to you for any specific questions about what was found during the examination.  If the procedure report does not answer your questions, please call your gastroenterologist to clarify.  If you requested that your care partner not be given the details of your procedure findings, then the procedure  report has been included in a sealed envelope for you to review at your convenience later.  YOU SHOULD EXPECT: Some feelings of bloating in the abdomen. Passage of more gas than usual.  Walking can help get rid of the air that was put into your GI tract during the procedure and reduce the bloating. If you had a lower endoscopy (such as a colonoscopy or flexible sigmoidoscopy) you may notice spotting of blood in your stool or on the toilet paper. If you underwent a bowel prep for your procedure, you may not have a normal bowel movement for a few days.  Please Note:  You might notice some irritation and congestion in your nose or some drainage.  This is from the oxygen used during your procedure.  There is no need for concern and it should clear up in a day or so.  SYMPTOMS TO REPORT IMMEDIATELY:   Following lower endoscopy (colonoscopy or flexible sigmoidoscopy):  Excessive amounts of blood in the stool  Significant tenderness or worsening of abdominal pains  Swelling of the abdomen that is new, acute  Fever of 100F or higher  For urgent or emergent issues, a gastroenterologist can be reached at any hour by calling [phone].   DIET: Your first meal following the procedure should be a small meal and then it is ok to progress to your normal diet. Heavy or fried foods are harder to digest and may make you feel nauseous or bloated.  Likewise, meals heavy in dairy and vegetables can increase bloating.  Drink plenty of fluids but you should avoid alcoholic beverages for 24 hours.  ACTIVITY:  You should plan to take it easy for the rest of today and you should NOT DRIVE or use heavy machinery until tomorrow (because of the sedation medicines used during the test).    FOLLOW UP: Our staff will call the number listed on your records the next business day following your procedure to check on you and address any questions or concerns that you may have regarding the information given to you  following your procedure. If we do not reach you, we will leave a message.  However, if you are feeling well and you are not experiencing any problems, there is no need to return our call.  We will assume that you have returned to your regular daily activities without incident.  If any biopsies were taken you will be contacted by phone or by letter within the next 1-3 weeks.  Please call us at [phone] if you have not heard about the biopsies in 3 weeks.    SIGNATURES/CONFIDENTIALITY: You and/or your care partner have signed paperwork which will be entered into your electronic medical record.  These signatures attest to the fact that that the information above on your After Visit Summary has been reviewed and is understood.  Full responsibility of the confidentiality of this discharge information lies with you and/or your care-partner.  Hemorrhoid information given. Diverticulosis and high  fiber diet informatin given.

## 2015-09-09 ENCOUNTER — Telehealth: Payer: Self-pay | Admitting: *Deleted

## 2015-09-09 NOTE — Telephone Encounter (Signed)
  Follow up Call-  Call back number 09/08/2015  Post procedure Call Back phone  # [phone]  Permission to leave phone message Yes     Patient questions:  Do you have a fever, pain , or abdominal swelling? No. Pain Score  0 *  Have you tolerated food without any problems? Yes.    Have you been able to return to your normal activities? Yes.    Do you have any questions about your discharge instructions: Diet   No. Medications  No. Follow up visit  No.  Do you have questions or concerns about your Care? No.  Actions: * If pain score is 4 or above: No action needed, pain <4.  "everyone was great!" per patient.

## 2015-09-28 DIAGNOSIS — R208 Other disturbances of skin sensation: Secondary | ICD-10-CM | POA: Diagnosis not present

## 2015-09-28 DIAGNOSIS — R2232 Localized swelling, mass and lump, left upper limb: Secondary | ICD-10-CM | POA: Diagnosis not present

## 2015-12-20 DIAGNOSIS — D559 Anemia due to enzyme disorder, unspecified: Secondary | ICD-10-CM | POA: Diagnosis not present

## 2015-12-20 DIAGNOSIS — E7800 Pure hypercholesterolemia, unspecified: Secondary | ICD-10-CM | POA: Diagnosis not present

## 2015-12-20 DIAGNOSIS — E785 Hyperlipidemia, unspecified: Secondary | ICD-10-CM | POA: Diagnosis not present

## 2015-12-20 DIAGNOSIS — M810 Age-related osteoporosis without current pathological fracture: Secondary | ICD-10-CM | POA: Diagnosis not present

## 2015-12-22 ENCOUNTER — Ambulatory Visit (INDEPENDENT_AMBULATORY_CARE_PROVIDER_SITE_OTHER): Payer: Medicare Other | Admitting: Urgent Care

## 2015-12-22 ENCOUNTER — Encounter: Payer: Self-pay | Admitting: Urgent Care

## 2015-12-22 VITALS — BP 130/82 | HR 77 | Temp 98.9°F | Resp 18 | Ht 61.0 in | Wt 136.0 lb

## 2015-12-22 DIAGNOSIS — H6981 Other specified disorders of Eustachian tube, right ear: Secondary | ICD-10-CM | POA: Diagnosis not present

## 2015-12-22 DIAGNOSIS — Z91048 Other nonmedicinal substance allergy status: Secondary | ICD-10-CM | POA: Diagnosis not present

## 2015-12-22 DIAGNOSIS — H938X1 Other specified disorders of right ear: Secondary | ICD-10-CM | POA: Diagnosis not present

## 2015-12-22 DIAGNOSIS — Z9109 Other allergy status, other than to drugs and biological substances: Secondary | ICD-10-CM

## 2015-12-22 MED ORDER — FLUTICASONE PROPIONATE 50 MCG/ACT NA SUSP: 2.0000 | Freq: Every day | NASAL | 6 refills | Status: DC

## 2015-12-22 MED ORDER — CETIRIZINE HCL 10 MG PO TABS: 10.0000 mg | ORAL_TABLET | Freq: Every day | ORAL | 11 refills | Status: DC

## 2015-12-22 NOTE — Patient Instructions (Addendum)
Barotitis Media Barotitis media is inflammation of your middle ear. This occurs when the auditory tube (eustachian tube) leading from the back of your nose (nasopharynx) to your eardrum is blocked. This blockage may result from a cold, environmental allergies, or an upper respiratory infection. Unresolved barotitis media may lead to damage or hearing loss (barotrauma), which may become permanent. HOME CARE INSTRUCTIONS   Use medicines as recommended by your health care provider. Over-the-counter medicines will help unblock the canal and can help during times of air travel.  Do not put anything into your ears to clean or unplug them. Eardrops will not be helpful.  Do not swim, dive, or fly until your health care provider says it is all right to do so. If these activities are necessary, chewing gum with frequent, forceful swallowing may help. It is also helpful to hold your nose and gently blow to pop your ears for equalizing pressure changes. This forces air into the eustachian tube.  Only take over-the-counter or prescription medicines for pain, discomfort, or fever as directed by your health care provider.  A decongestant may be helpful in decongesting the middle ear and make pressure equalization easier. SEEK MEDICAL CARE IF:  You experience a serious form of dizziness in which you feel as if the room is spinning and you feel nauseated (vertigo).  Your symptoms only involve one ear. SEEK IMMEDIATE MEDICAL CARE IF:   You develop a severe headache, dizziness, or severe ear pain.  You have bloody or pus-like drainage from your ears.  You develop a fever.  Your problems do not improve or become worse. MAKE SURE YOU:   Understand these instructions.  Will watch your condition.  Will get help right away if you are not doing well or get worse.   This information is not intended to replace advice given to you by your health care provider. Make sure you discuss any questions you have with  your health care provider.   Document Released: 05/05/2000 Document Revised: 02/26/2013 Document Reviewed: 12/03/2012 Elsevier Interactive Patient Education 2016 [employer].     IF you received an x-ray today, you will receive an invoice from [facility]. Please contact [facility] at [phone] with questions or concerns regarding your invoice.   IF you received labwork today, you will receive an invoice from [employer]. Please contact [facility] at [phone] with questions or concerns regarding your invoice.   Our billing staff will not be able to assist you with questions regarding bills from these companies.  You will be contacted with the lab results as soon as they are available. The fastest way to get your results is to activate your My Chart account. Instructions are located on the last page of this paperwork. If you have not heard from [place] regarding the results in 2 weeks, please contact this office.

## 2015-12-22 NOTE — Progress Notes (Signed)
    MRN: [id number] DOB: 1940/06/08  Subjective:   [name] is a 75 y.o. female presenting for chief complaint of Ear Pain (pressure on right ear)  Reports 1 day history of right ear pressure. Has not tried any medications. Denies fever, tinnitus, decreased hearing, sinus pain, sinus congestion, sore throat, cough. Denies swimming in [place] or pool. Admits history of seasonal allergies, managed with Zyrtec.  [name] has a current medication list which includes the following prescription(s): omeprazole and vitamin c. Also has No Known Allergies.  [name]  has a past medical history of Allergy; Arthritis; Blood transfusion (1981); Blood transfusion without reported diagnosis; Cellulitis; Diverticulitis; GERD (gastroesophageal reflux disease); Osteopenia; and Osteoporosis. Also  has a past surgical history that includes laparotomy (1981); Tubal ligation; Laparoscopic assisted vaginal hysterectomy (07/25/2011); Salpingoophorectomy (07/25/2011); Anterior and posterior repair (07/25/2011); Bladder suspension (07/25/2011); Cystoscopy (07/25/2011); Colonoscopy (08-12-2002); and Abdominal hysterectomy.  Objective:   Vitals: BP 130/82   Pulse 77   Temp 98.9 F (37.2 C) (Oral)   Resp 18   Ht 5\' 1"  (1.549 m)   Wt 136 lb (61.7 kg)   SpO2 98%   BMI 25.70 kg/m   Physical Exam  Constitutional: She is oriented to person, place, and time. She appears well-developed and well-nourished.  HENT:  Right TM with slight effusion but TM's intact bilaterally, no effusions or erythema. Nasal turbinates pink and moist, nasal passages patent. No sinus tenderness. Oropharynx clear, mucous membranes moist, dentition in good repair.  Cardiovascular: Normal rate.   Pulmonary/Chest: Effort normal.  Neurological: She is alert and oriented to person, place, and time.   Assessment and Plan :   1. Eustachian tube dysfunction, right 2. Ear pressure, right 3. Environmental allergies - Counseled patient on diagnosis, restart  Zyrtec and add Flonase. RTC if no improvement in 1 week or sooner if worsening despite medication regimen.  Jaynee Eagles, PA-C [facility] [phone] 12/22/2015 2:33 PM

## 2015-12-28 DIAGNOSIS — D559 Anemia due to enzyme disorder, unspecified: Secondary | ICD-10-CM | POA: Diagnosis not present

## 2016-01-26 DIAGNOSIS — R0981 Nasal congestion: Secondary | ICD-10-CM | POA: Diagnosis not present

## 2016-02-03 DIAGNOSIS — Z23 Encounter for immunization: Secondary | ICD-10-CM | POA: Diagnosis not present

## 2016-02-10 DIAGNOSIS — H2513 Age-related nuclear cataract, bilateral: Secondary | ICD-10-CM | POA: Diagnosis not present

## 2016-02-10 DIAGNOSIS — H40013 Open angle with borderline findings, low risk, bilateral: Secondary | ICD-10-CM | POA: Diagnosis not present

## 2016-02-10 DIAGNOSIS — H5203 Hypermetropia, bilateral: Secondary | ICD-10-CM | POA: Diagnosis not present

## 2016-02-10 DIAGNOSIS — H353121 Nonexudative age-related macular degeneration, left eye, early dry stage: Secondary | ICD-10-CM | POA: Diagnosis not present

## 2016-08-11 ENCOUNTER — Other Ambulatory Visit: Payer: Self-pay | Admitting: Internal Medicine

## 2016-08-11 DIAGNOSIS — M8580 Other specified disorders of bone density and structure, unspecified site: Secondary | ICD-10-CM

## 2016-08-11 DIAGNOSIS — Z1231 Encounter for screening mammogram for malignant neoplasm of breast: Secondary | ICD-10-CM

## 2016-09-07 ENCOUNTER — Ambulatory Visit
Admission: RE | Admit: 2016-09-07 | Discharge: 2016-09-07 | Disposition: A | Discharge status: Home / Self Care | Payer: Medicare Other | Source: Ambulatory Visit | Attending: Internal Medicine | Admitting: Internal Medicine

## 2016-09-07 DIAGNOSIS — M8580 Other specified disorders of bone density and structure, unspecified site: Secondary | ICD-10-CM

## 2016-09-07 DIAGNOSIS — Z1231 Encounter for screening mammogram for malignant neoplasm of breast: Secondary | ICD-10-CM

## 2016-10-18 ENCOUNTER — Ambulatory Visit (INDEPENDENT_AMBULATORY_CARE_PROVIDER_SITE_OTHER): Payer: Medicare Other | Admitting: Physician Assistant

## 2016-10-18 ENCOUNTER — Encounter: Payer: Self-pay | Admitting: Physician Assistant

## 2016-10-18 VITALS — BP 134/85 | HR 80 | Temp 98.7°F | Resp 18 | Ht 60.67 in | Wt 136.4 lb

## 2016-10-18 DIAGNOSIS — N898 Other specified noninflammatory disorders of vagina: Secondary | ICD-10-CM

## 2016-10-18 LAB — POCT WET + KOH PREP
Trich by wet prep: ABSENT
Yeast by KOH: ABSENT

## 2016-10-18 MED ORDER — FLUCONAZOLE 150 MG PO TABS: 150.0000 mg | ORAL_TABLET | ORAL | 0 refills | Status: DC

## 2016-10-18 NOTE — Progress Notes (Signed)
PRIMARY CARE AT [facility] [address], [place] [address] [phone]  Date:  10/18/2016   Name:  [name]   DOB:  08-Aug-1940   MRN:  [id number]  PCP:  Levin Erp, MD    History of Present Illness:  [name] is a 76 y.o. female patient who presents to PCP with  Chief Complaint  Patient presents with  . Vaginal Itching    X 1 week- with some swelling     1 week of vaginal itching.  No odor.  No dysuria, frequency, or hematuria.  She feels swollen and sees redness.  She has been using monistat for the last 3 days.  No change in hygeine products.  She has no current change in medicaitons.  No current antibiotics.  No trouble with diabetes or elevated blood sugar.   There are no active problems to display for this patient.   Past Medical History:  Diagnosis Date  . Allergy    seasonal  . Arthritis   . [facility]  . Blood transfusion without reported diagnosis   . Cellulitis   . Diverticulitis    @ 3 yrs ago- treated with abx- no issues since   . GERD (gastroesophageal reflux disease)    no RX  . Osteopenia   . Osteoporosis     Past Surgical History:  Procedure Laterality Date  . ABDOMINAL HYSTERECTOMY    . ANTERIOR AND POSTERIOR REPAIR  07/25/2011   Procedure: ANTERIOR (CYSTOCELE) AND POSTERIOR REPAIR (RECTOCELE);  Surgeon: Darlyn Chamber, MD;  Location: [facility];  Service: Gynecology;  Laterality: N/A;  with Sacrospinous Ligament Suspension  . BLADDER SUSPENSION  07/25/2011   Procedure: TRANSVAGINAL TAPE (TVT) PROCEDURE;  Surgeon: Darlyn Chamber, MD;  Location: [facility];  Service: Gynecology;  Laterality: N/A;  Transobturator Sling  . COLONOSCOPY  08-12-2002   tics only  . CYSTOSCOPY  07/25/2011   Procedure: CYSTOSCOPY;  Surgeon: Darlyn Chamber, MD;  Location: [facility];  Service: Gynecology;  Laterality: N/A;  . LAPAROSCOPIC ASSISTED VAGINAL HYSTERECTOMY  07/25/2011   Procedure: LAPAROSCOPIC ASSISTED VAGINAL HYSTERECTOMY;  Surgeon: Darlyn Chamber,  MD;  Location: [facility];  Service: Gynecology;  Laterality: N/A;  . LAPAROTOMY  1981  . SALPINGOOPHORECTOMY  07/25/2011   Procedure: SALPINGO OOPHERECTOMY;  Surgeon: Darlyn Chamber, MD;  Location: [facility];  Service: Gynecology;  Laterality: Bilateral;  . TUBAL LIGATION      Social History  Substance Use Topics  . Smoking status: Former Research scientist (life sciences)  . Smokeless tobacco: Never Used  . Alcohol use 0.0 oz/week     Comment: socially- daily has 2 drinks a day     Family History  Problem Relation Age of Onset  . Adopted: Yes  . Colon cancer Neg Hx     No Known Allergies  Medication list has been reviewed and updated.  Current Outpatient Prescriptions on File Prior to Visit  Medication Sig Dispense Refill  . omeprazole (PRILOSEC) 20 MG capsule Take 20 mg by mouth daily.    . cetirizine (ZYRTEC) 10 MG tablet Take 1 tablet (10 mg total) by mouth daily. (Patient not taking: Reported on 10/18/2016) 30 tablet 11  . fluticasone (FLONASE) 50 MCG/ACT nasal spray Place 2 sprays into both nostrils daily. (Patient not taking: Reported on 10/18/2016) 16 g 6  . vitamin C (ASCORBIC ACID) 500 MG tablet Take 500 mg by mouth daily.     No current facility-administered medications on file  prior to visit.     ROS ROS otherwise unremarkable unless listed above.  Physical Examination: BP (!) 151/78 (BP Location: Right Arm, Patient Position: Sitting, Cuff Size: Small)   Pulse 74   Temp 98.7 F (37.1 C) (Oral)   Resp 18   Ht 5' 0.67" (1.541 m)   Wt 136 lb 6.4 oz (61.9 kg)   SpO2 98%   BMI 26.05 kg/m  Ideal Body Weight: Weight in (lb) to have BMI = 25: 130.6  Physical Exam  Constitutional: She is oriented to person, place, and time. She appears well-developed and well-nourished. No distress.  HENT:  Head: Normocephalic and atraumatic.  Right Ear: External ear normal.  Left Ear: External ear normal.  Eyes: Conjunctivae and EOM are normal. Pupils are equal, round, and reactive to light.  Cardiovascular:  Normal rate.   Pulmonary/Chest: Effort normal. No respiratory distress.  Genitourinary: Pelvic exam was performed with patient supine. There is rash on the right labia. There is rash on the left labia. Vaginal discharge (white) found.  Genitourinary Comments: Labia majora erythematous and swelling  Neurological: She is alert and oriented to person, place, and time.  Skin: She is not diaphoretic.  Psychiatric: She has a normal mood and affect. Her behavior is normal.    Results for orders placed or performed in visit on 10/18/16  POCT Wet + KOH Prep  Result Value Ref Range   Yeast by KOH Absent Absent   Yeast by wet prep Present (A) Absent   WBC by wet prep None (A) Few   Clue Cells Wet Prep HPF POC None None   Trich by wet prep Absent Absent   Bacteria Wet Prep HPF POC Many (A) Few   Epithelial Cells By [employer] Pref ([facility]) Moderate (A) None, Few, Too numerous to count   RBC,UR,HPF,POC None None RBC/hpf     Assessment and Plan: [name] is a 76 y.o. female who is here today for vaginal irritation Vaginal irritation - Plan: POCT Wet + KOH Prep, fluconazole (DIFLUCAN) 150 MG tablet  Ivar Drape, PA-C [facility] 6/1/201812:56 PM

## 2016-10-18 NOTE — Patient Instructions (Addendum)
Please use desitin (zinc oxide) on the area to calm the irritation Take medication as prescribed.    Vaginal Yeast infection, Adult Vaginal yeast infection is a condition that causes soreness, swelling, and redness (inflammation) of the vagina. It also causes vaginal discharge. This is a common condition. Some women get this infection frequently. What are the causes? This condition is caused by a change in the normal balance of the yeast (candida) and bacteria that live in the vagina. This change causes an overgrowth of yeast, which causes the inflammation. What increases the risk? This condition is more likely to develop in:  Women who take antibiotic medicines.  Women who have diabetes.  Women who take birth control pills.  Women who are pregnant.  Women who douche often.  Women who have a weak defense (immune) system.  Women who have been taking steroid medicines for a long time.  Women who frequently wear tight clothing. What are the signs or symptoms? Symptoms of this condition include:  White, thick vaginal discharge.  Swelling, itching, redness, and irritation of the vagina. The lips of the vagina (vulva) may be affected as well.  Pain or a burning feeling while urinating.  Pain during sex. How is this diagnosed? This condition is diagnosed with a medical history and physical exam. This will include a pelvic exam. Your health care provider will examine a sample of your vaginal discharge under a microscope. Your health care provider may send this sample for testing to confirm the diagnosis. How is this treated? This condition is treated with medicine. Medicines may be over-the-counter or prescription. You may be told to use one or more of the following:  Medicine that is taken orally.  Medicine that is applied as a cream.  Medicine that is inserted directly into the vagina (suppository). Follow these instructions at home:  Take or apply over-the-counter and  prescription medicines only as told by your health care provider.  Do not have sex until your health care provider has approved. Tell your sex partner that you have a yeast infection. That person should go to his or her health care provider if he or she develops symptoms.  Do not wear tight clothes, such as pantyhose or tight pants.  Avoid using tampons until your health care provider approves.  Eat more yogurt. This may help to keep your yeast infection from returning.  Try taking a sitz bath to help with discomfort. This is a warm water bath that is taken while you are sitting down. The water should only come up to your hips and should cover your buttocks. Do this 3-4 times per day or as told by your health care provider.  Do not douche.  Wear breathable, cotton underwear.  If you have diabetes, keep your blood sugar levels under control. Contact a health care provider if:  You have a fever.  Your symptoms go away and then return.  Your symptoms do not get better with treatment.  Your symptoms get worse.  You have new symptoms.  You develop blisters in or around your vagina.  You have blood coming from your vagina and it is not your menstrual period.  You develop pain in your abdomen. This information is not intended to replace advice given to you by your health care provider. Make sure you discuss any questions you have with your health care provider. Document Released: 02/15/2005 Document Revised: 10/20/2015 Document Reviewed: 11/09/2014 Elsevier Interactive Patient Education  2017 [employer].     IF  you received an x-ray today, you will receive an invoice from [facility]. Please contact [facility] at [phone] with questions or concerns regarding your invoice.   IF you received labwork today, you will receive an invoice from [place]. Please contact [facility] at [phone] with questions or concerns regarding your invoice.   Our billing  staff will not be able to assist you with questions regarding bills from these companies.  You will be contacted with the lab results as soon as they are available. The fastest way to get your results is to activate your My Chart account. Instructions are located on the last page of this paperwork. If you have not heard from [place] regarding the results in 2 weeks, please contact this office.

## 2017-02-18 ENCOUNTER — Emergency Department (HOSPITAL_BASED_OUTPATIENT_CLINIC_OR_DEPARTMENT_OTHER)
Admission: EM | Admit: 2017-02-18 | Discharge: 2017-02-18 | Disposition: A | Discharge status: Home / Self Care | Payer: Medicare Other | Attending: Emergency Medicine | Admitting: Emergency Medicine

## 2017-02-18 ENCOUNTER — Emergency Department (HOSPITAL_BASED_OUTPATIENT_CLINIC_OR_DEPARTMENT_OTHER): Payer: Medicare Other

## 2017-02-18 ENCOUNTER — Encounter (HOSPITAL_BASED_OUTPATIENT_CLINIC_OR_DEPARTMENT_OTHER): Payer: Self-pay | Admitting: Emergency Medicine

## 2017-02-18 DIAGNOSIS — R109 Unspecified abdominal pain: Secondary | ICD-10-CM | POA: Diagnosis present

## 2017-02-18 DIAGNOSIS — R110 Nausea: Secondary | ICD-10-CM | POA: Insufficient documentation

## 2017-02-18 DIAGNOSIS — K5732 Diverticulitis of large intestine without perforation or abscess without bleeding: Secondary | ICD-10-CM

## 2017-02-18 DIAGNOSIS — Z87891 Personal history of nicotine dependence: Secondary | ICD-10-CM | POA: Diagnosis not present

## 2017-02-18 DIAGNOSIS — Z79899 Other long term (current) drug therapy: Secondary | ICD-10-CM | POA: Insufficient documentation

## 2017-02-18 LAB — CBC
HEMATOCRIT: 33.3 % — AB (ref 36.0–46.0)
HEMOGLOBIN: 10 g/dL — AB (ref 12.0–15.0)
MCH: 23.3 pg — ABNORMAL LOW (ref 26.0–34.0)
MCHC: 30 g/dL (ref 30.0–36.0)
MCV: 77.6 fL — AB (ref 78.0–100.0)
Platelets: 185 10*3/uL (ref 150–400)
RBC: 4.29 MIL/uL (ref 3.87–5.11)
RDW: 15.3 % (ref 11.5–15.5)
WBC: 13.9 10*3/uL — ABNORMAL HIGH (ref 4.0–10.5)

## 2017-02-18 LAB — COMPREHENSIVE METABOLIC PANEL
ALK PHOS: 79 U/L (ref 38–126)
ALT: 15 U/L (ref 14–54)
AST: 29 U/L (ref 15–41)
Albumin: 4.2 g/dL (ref 3.5–5.0)
Anion gap: 7 (ref 5–15)
BILIRUBIN TOTAL: 0.8 mg/dL (ref 0.3–1.2)
BUN: 12 mg/dL (ref 6–20)
CO2: 24 mmol/L (ref 22–32)
CREATININE: 0.96 mg/dL (ref 0.44–1.00)
Calcium: 8.8 mg/dL — ABNORMAL LOW (ref 8.9–10.3)
Chloride: 105 mmol/L (ref 101–111)
GFR calc Af Amer: >60 mL/min (ref >60)
GFR, EST NON AFRICAN AMERICAN: 56 mL/min — AB (ref >60)
Glucose, Bld: 131 mg/dL — ABNORMAL HIGH (ref 65–99)
Potassium: 4.3 mmol/L (ref 3.5–5.1)
Sodium: 136 mmol/L (ref 135–145)
TOTAL PROTEIN: 7.5 g/dL (ref 6.5–8.1)

## 2017-02-18 LAB — URINALYSIS, ROUTINE W REFLEX MICROSCOPIC
BILIRUBIN URINE: NEGATIVE
Glucose, UA: NEGATIVE mg/dL
Hgb urine dipstick: NEGATIVE
Ketones, ur: NEGATIVE mg/dL
Leukocytes, UA: NEGATIVE
NITRITE: NEGATIVE
PH: 6 (ref 5.0–8.0)
Protein, ur: NEGATIVE mg/dL
SPECIFIC GRAVITY, URINE: 1.02 (ref 1.005–1.030)

## 2017-02-18 LAB — LIPASE, BLOOD: Lipase: 34 U/L (ref 11–51)

## 2017-02-18 MED ORDER — CIPROFLOXACIN HCL 500 MG PO TABS: 500.0000 mg | ORAL_TABLET | Freq: Two times a day (BID) | ORAL | 0 refills | Status: DC

## 2017-02-18 MED ORDER — SODIUM CHLORIDE 0.9 % IV BOLUS (SEPSIS)
1000.0000 mL | Freq: Once | INTRAVENOUS | Status: AC
  Administered 2017-02-18: 1000 mL via INTRAVENOUS

## 2017-02-18 MED ORDER — CIPROFLOXACIN HCL 500 MG PO TABS
500.0000 mg | ORAL_TABLET | Freq: Two times a day (BID) | ORAL | Status: DC
  Administered 2017-02-18: 500 mg via ORAL
  Filled 2017-02-18: qty 1

## 2017-02-18 MED ORDER — ONDANSETRON HCL 4 MG PO TABS: 4.0000 mg | ORAL_TABLET | Freq: Three times a day (TID) | ORAL | 0 refills | Status: DC | PRN

## 2017-02-18 MED ORDER — ACETAMINOPHEN 500 MG PO TABS
1000.0000 mg | ORAL_TABLET | Freq: Once | ORAL | Status: AC
  Administered 2017-02-18: 1000 mg via ORAL
  Filled 2017-02-18: qty 2

## 2017-02-18 MED ORDER — METRONIDAZOLE 500 MG PO TABS: 500.0000 mg | ORAL_TABLET | Freq: Two times a day (BID) | ORAL | 0 refills | Status: DC

## 2017-02-18 MED ORDER — HYDROCODONE-ACETAMINOPHEN 5-325 MG PO TABS: 1.0000 | ORAL_TABLET | Freq: Four times a day (QID) | ORAL | 0 refills | Status: DC | PRN

## 2017-02-18 MED ORDER — METRONIDAZOLE 500 MG PO TABS
500.0000 mg | ORAL_TABLET | Freq: Once | ORAL | Status: AC
  Administered 2017-02-18: 500 mg via ORAL
  Filled 2017-02-18: qty 1

## 2017-02-18 MED ORDER — IOPAMIDOL (ISOVUE-300) INJECTION 61%
100.0000 mL | Freq: Once | INTRAVENOUS | Status: AC | PRN
  Administered 2017-02-18: 100 mL via INTRAVENOUS

## 2017-02-18 NOTE — ED Notes (Signed)
ED Provider at bedside. 

## 2017-02-18 NOTE — ED Provider Notes (Signed)
[facility] Provider Note   CSN: [id number] Arrival date & time: 02/18/17  1145     History   Chief Complaint Chief Complaint  Patient presents with  . Abdominal Pain    HPI [name] is a 76 y.o. female who presents emergency Department with chief complaint of abdominal pain. She has had on and off generalized and crampy abdominal pain that has now localized to the suprapubic and left side of her abdomen. She states that yesterday she had several episodes where her belly hurt very badly and she had to lay down. She is awoken this morning at 3 AM with severe pain. She denies pain in her flanks, dysuria, frequency or urgency. She does have pressure and pain in the suprapubic region that extends to the left side of her abdomen. She denies constipation but states that her bowel movements have not been cathartic of late. She has had some mild nausea without vomiting. She denies fever. She is previous history of diverticulitis and states that this is not the same. She also has a history of a hysterectomy.  HPI  Past Medical History:  Diagnosis Date  . Allergy    seasonal  . Arthritis   . Blood transfusion [facility]  . Blood transfusion without reported diagnosis   . Cellulitis   . Diverticulitis    @ 3 yrs ago- treated with abx- no issues since   . GERD (gastroesophageal reflux disease)    no RX  . Osteopenia   . Osteoporosis     There are no active problems to display for this patient.   Past Surgical History:  Procedure Laterality Date  . ABDOMINAL HYSTERECTOMY    . ANTERIOR AND POSTERIOR REPAIR  07/25/2011   Procedure: ANTERIOR (CYSTOCELE) AND POSTERIOR REPAIR (RECTOCELE);  Surgeon: Darlyn Chamber, MD;  Location: [facility];  Service: Gynecology;  Laterality: N/A;  with Sacrospinous Ligament Suspension  . BLADDER SUSPENSION  07/25/2011   Procedure: TRANSVAGINAL TAPE (TVT) PROCEDURE;  Surgeon: Darlyn Chamber, MD;  Location: [facility];  Service: Gynecology;   Laterality: N/A;  Transobturator Sling  . COLONOSCOPY  08-12-2002   tics only  . CYSTOSCOPY  07/25/2011   Procedure: CYSTOSCOPY;  Surgeon: Darlyn Chamber, MD;  Location: [facility];  Service: Gynecology;  Laterality: N/A;  . LAPAROSCOPIC ASSISTED VAGINAL HYSTERECTOMY  07/25/2011   Procedure: LAPAROSCOPIC ASSISTED VAGINAL HYSTERECTOMY;  Surgeon: Darlyn Chamber, MD;  Location: [facility];  Service: Gynecology;  Laterality: N/A;  . LAPAROTOMY  1981  . SALPINGOOPHORECTOMY  07/25/2011   Procedure: SALPINGO OOPHERECTOMY;  Surgeon: Darlyn Chamber, MD;  Location: [facility];  Service: Gynecology;  Laterality: Bilateral;  . TUBAL LIGATION      OB History    No data available       Home Medications    Prior to Admission medications   Medication Sig Start Date End Date Taking? Authorizing Provider  cetirizine (ZYRTEC) 10 MG tablet Take 1 tablet (10 mg total) by mouth daily. Patient not taking: Reported on 10/18/2016 12/22/15   Jaynee Eagles, PA-C  fluconazole (DIFLUCAN) 150 MG tablet Take 1 tablet (150 mg total) by mouth every 3 (three) days. Repeat if needed 10/18/16   English, Stephanie D, PA  fluticasone (FLONASE) 50 MCG/ACT nasal spray Place 2 sprays into both nostrils daily. Patient not taking: Reported on 10/18/2016 12/22/15   Jaynee Eagles, PA-C  omeprazole (PRILOSEC) 20 MG capsule Take 20 mg by mouth daily.  [provider]  vitamin C (ASCORBIC ACID) 500 MG tablet Take 500 mg by mouth daily.    [provider]    Family History Family History  Problem Relation Age of Onset  . Adopted: Yes  . Colon cancer Neg Hx     Social History Social History  Substance Use Topics  . Smoking status: Former Research scientist (life sciences)  . Smokeless tobacco: Never Used  . Alcohol use 0.0 oz/week     Comment: socially- daily has 2 drinks a day      Allergies   Patient has no known allergies.   Review of Systems Review of Systems  Ten systems reviewed and are negative for acute change, except as noted in the HPI.    Physical Exam Updated Vital Signs BP 137/81 (BP Location: Left Arm)   Pulse 86   Temp (!) 100.4 F (38 C) (Oral)   Resp 18   Ht 5\' 1"  (1.549 m)   Wt 61.2 kg (135 lb)   SpO2 100%   BMI 25.51 kg/m   Physical Exam  Constitutional: She is oriented to person, place, and time. She appears well-developed and well-nourished. No distress.  HENT:  Head: Normocephalic and atraumatic.  Eyes: Conjunctivae are normal. No scleral icterus.  Neck: Normal range of motion.  Cardiovascular: Normal rate, regular rhythm and normal heart sounds.  Exam reveals no gallop and no friction rub.   No murmur heard. Pulmonary/Chest: Effort normal and breath sounds normal. No respiratory distress.  Abdominal: Soft. Bowel sounds are normal. She exhibits no distension and no mass. There is tenderness. There is no guarding.  Tenderness in the mid suprapubic and left lower and left upper quadrants of the abdomen  Neurological: She is alert and oriented to person, place, and time.  Skin: Skin is warm and dry. She is not diaphoretic.  Psychiatric: Her behavior is normal.  Nursing note and vitals reviewed.    ED Treatments / Results  Labs (all labs ordered are listed, but only abnormal results are displayed) Labs Reviewed  URINALYSIS, ROUTINE W REFLEX MICROSCOPIC  LIPASE, BLOOD  COMPREHENSIVE METABOLIC PANEL  CBC    EKG  EKG Interpretation None       Radiology No results found.  Procedures Procedures (including critical care time)  Medications Ordered in ED Medications - No data to display   Initial Impression / Assessment and Plan / ED Course  I have reviewed the triage vital signs and the nursing notes.  Pertinent labs & imaging results that were available during my care of the patient were reviewed by me and considered in my medical decision making (see chart for details).     Patient with sigmoid diverticulitis. No apparent rupture or abscess although the radiologist calls it  severe. Patient appears well. She's tolerating by mouth's. She's not actively vomiting, she had minimal fever upon arrival which was treated and easily resolved with Tylenol. Feel she is safe for discharge home with bowel rest, antibiotics and close follow-up with her PCP. Patient agrees with plan of care. She will be treated with the medications listed below.  Final Clinical Impressions(s) / ED Diagnoses   Final diagnoses:  Sigmoid diverticulitis    New Prescriptions New Prescriptions   No medications on file     Margarita Mail, PA-C 02/18/17 1656    Charlesetta Shanks, MD 02/27/17 [phone]

## 2017-02-18 NOTE — ED Triage Notes (Signed)
Patient states that she has had pain to her generalized abdominal region intermittently since Friday evening. The patient reports that last night she started to have epigastric pain.

## 2017-02-18 NOTE — ED Provider Notes (Signed)
Medical screening examination/treatment/procedure(s) were conducted as a shared visit with non-physician practitioner(s) and myself.  I personally evaluated the patient during the encounter.   EKG Interpretation None     patient has had abdominal pain is been coming and going with cramping quality. It has now localized to the left abdomen. She has had episodes of severe pain. Patient has known history of diverticulitis. Patient is alert and nontoxic. No respiratory distress. Heart and lungs within normal limits. Abdomen soft. Tender left lower quadrant but without guarding. CT confirms diverticulitis. Patient is alert and nontoxic and tolerating oral intake. Plan will be to initiate treatment outpatient with oral antibiotics. Return precautions are reviewed.   Charlesetta Shanks, MD 02/23/17 [phone]

## 2017-02-18 NOTE — ED Notes (Signed)
CT will need to wait for bun/creat to result prior to exam per radiology protocol, pt >60 yrs of age, pt will be given PO contrast during wait time

## 2017-02-18 NOTE — ED Notes (Signed)
Pt ambulated to restroom. 

## 2017-02-18 NOTE — ED Notes (Signed)
Pt on auto VS  

## 2017-02-18 NOTE — Discharge Instructions (Signed)
°  Follow these instructions at home: Follow your health care provider?s instructions carefully. Follow a full liquid diet or other diet as directed by your health care provider. After your symptoms improve, your health care provider may tell you to change your diet. He or she may recommend you eat a high-fiber diet. Fruits and vegetables are good sources of fiber. Fiber makes it easier to pass stool. Take fiber supplements or probiotics as directed by your health care provider. Only take medicines as directed by your health care provider. Keep all your follow-up appointments. Contact a health care provider if: Your pain does not improve. You have a hard time eating food. Your bowel movements do not return to normal. Get help right away if: Your pain becomes worse. Your symptoms do not get better. Your symptoms suddenly get worse. You have a fever. You have repeated vomiting. You have bloody or black, tarry stools

## 2017-02-18 NOTE — ED Notes (Signed)
Pt discharged to home with family. NAD.  

## 2017-08-21 ENCOUNTER — Encounter: Payer: Self-pay | Admitting: Physician Assistant

## 2017-10-25 ENCOUNTER — Other Ambulatory Visit: Payer: Self-pay | Admitting: Internal Medicine

## 2017-10-25 DIAGNOSIS — Z1231 Encounter for screening mammogram for malignant neoplasm of breast: Secondary | ICD-10-CM

## 2017-11-14 ENCOUNTER — Ambulatory Visit
Admission: RE | Admit: 2017-11-14 | Discharge: 2017-11-14 | Disposition: A | Discharge status: Home / Self Care | Payer: Medicare Other | Source: Ambulatory Visit | Attending: Internal Medicine | Admitting: Internal Medicine

## 2017-11-14 DIAGNOSIS — Z1231 Encounter for screening mammogram for malignant neoplasm of breast: Secondary | ICD-10-CM

## 2018-11-21 ENCOUNTER — Emergency Department (HOSPITAL_BASED_OUTPATIENT_CLINIC_OR_DEPARTMENT_OTHER)
Admission: EM | Admit: 2018-11-21 | Discharge: 2018-11-21 | Disposition: A | Discharge status: Home / Self Care | Payer: Medicare Other | Attending: Emergency Medicine | Admitting: Emergency Medicine

## 2018-11-21 ENCOUNTER — Other Ambulatory Visit: Payer: Self-pay

## 2018-11-21 ENCOUNTER — Encounter (HOSPITAL_BASED_OUTPATIENT_CLINIC_OR_DEPARTMENT_OTHER): Payer: Self-pay | Admitting: Emergency Medicine

## 2018-11-21 ENCOUNTER — Emergency Department (HOSPITAL_BASED_OUTPATIENT_CLINIC_OR_DEPARTMENT_OTHER): Payer: Medicare Other

## 2018-11-21 DIAGNOSIS — Z79899 Other long term (current) drug therapy: Secondary | ICD-10-CM | POA: Insufficient documentation

## 2018-11-21 DIAGNOSIS — K5792 Diverticulitis of intestine, part unspecified, without perforation or abscess without bleeding: Secondary | ICD-10-CM

## 2018-11-21 DIAGNOSIS — K5732 Diverticulitis of large intestine without perforation or abscess without bleeding: Secondary | ICD-10-CM | POA: Insufficient documentation

## 2018-11-21 DIAGNOSIS — R1032 Left lower quadrant pain: Secondary | ICD-10-CM | POA: Diagnosis present

## 2018-11-21 DIAGNOSIS — Z87891 Personal history of nicotine dependence: Secondary | ICD-10-CM | POA: Diagnosis not present

## 2018-11-21 LAB — URINALYSIS, ROUTINE W REFLEX MICROSCOPIC
Bilirubin Urine: NEGATIVE
Glucose, UA: NEGATIVE mg/dL
Hgb urine dipstick: NEGATIVE
Ketones, ur: NEGATIVE mg/dL
Leukocytes,Ua: NEGATIVE
Nitrite: NEGATIVE
Protein, ur: NEGATIVE mg/dL
Specific Gravity, Urine: >1.03 — ABNORMAL HIGH (ref 1.005–1.030)
pH: 5.5 (ref 5.0–8.0)

## 2018-11-21 LAB — CBC WITH DIFFERENTIAL/PLATELET
Abs Immature Granulocytes: 0.03 10*3/uL (ref 0.00–0.07)
Basophils Absolute: 0.1 10*3/uL (ref 0.0–0.1)
Basophils Relative: 1 %
Eosinophils Absolute: 0.1 10*3/uL (ref 0.0–0.5)
Eosinophils Relative: 1 %
HCT: 45.4 % (ref 36.0–46.0)
Hemoglobin: 15 g/dL (ref 12.0–15.0)
Immature Granulocytes: 0 %
Lymphocytes Relative: 19 %
Lymphs Abs: 1.7 10*3/uL (ref 0.7–4.0)
MCH: 32.1 pg (ref 26.0–34.0)
MCHC: 33 g/dL (ref 30.0–36.0)
MCV: 97 fL (ref 80.0–100.0)
Monocytes Absolute: 1 10*3/uL (ref 0.1–1.0)
Monocytes Relative: 11 %
Neutro Abs: 6.1 10*3/uL (ref 1.7–7.7)
Neutrophils Relative %: 68 %
Platelets: 164 10*3/uL (ref 150–400)
RBC: 4.68 MIL/uL (ref 3.87–5.11)
RDW: 12.9 % (ref 11.5–15.5)
WBC: 9 10*3/uL (ref 4.0–10.5)
nRBC: 0 % (ref 0.0–0.2)

## 2018-11-21 LAB — COMPREHENSIVE METABOLIC PANEL
ALT: 22 U/L (ref 0–44)
AST: 16 U/L (ref 15–41)
Albumin: 4.2 g/dL (ref 3.5–5.0)
Alkaline Phosphatase: 70 U/L (ref 38–126)
Anion gap: 9 (ref 5–15)
BUN: 13 mg/dL (ref 8–23)
CO2: 24 mmol/L (ref 22–32)
Calcium: 9.5 mg/dL (ref 8.9–10.3)
Chloride: 107 mmol/L (ref 98–111)
Creatinine, Ser: 0.92 mg/dL (ref 0.44–1.00)
GFR calc Af Amer: >60 mL/min (ref >60)
GFR calc non Af Amer: >60 mL/min (ref >60)
Glucose, Bld: 127 mg/dL — ABNORMAL HIGH (ref 70–99)
Potassium: 4.3 mmol/L (ref 3.5–5.1)
Sodium: 140 mmol/L (ref 135–145)
Total Bilirubin: 0.8 mg/dL (ref 0.3–1.2)
Total Protein: 6.6 g/dL (ref 6.5–8.1)

## 2018-11-21 LAB — LIPASE, BLOOD: Lipase: 32 U/L (ref 11–51)

## 2018-11-21 MED ORDER — IOHEXOL 300 MG/ML  SOLN
100.0000 mL | Freq: Once | INTRAMUSCULAR | Status: AC | PRN
  Administered 2018-11-21: 100 mL via INTRAVENOUS

## 2018-11-21 MED ORDER — AMOXICILLIN-POT CLAVULANATE 875-125 MG PO TABS
1.0000 | ORAL_TABLET | Freq: Three times a day (TID) | ORAL | 0 refills | Status: AC
Start: 2018-11-21 — End: 2018-12-01

## 2018-11-21 NOTE — ED Triage Notes (Signed)
LLQ pain x 2 days

## 2018-11-21 NOTE — Discharge Instructions (Signed)
Please take your antibiotic three times a day for ten days.  Eat a soft diet while you recover from your diverticulitis.  You can resume a regular diet as you are able to tolerate.  If you start to get fever, or severe abdominal pain, or bloody stools/vomiting, please come back to the Emergency department.

## 2018-11-21 NOTE — ED Provider Notes (Signed)
[facility]ENC[facility]PARTMENT Provider Note   CSN: [id number] Arrival date & time: 11/21/18  5366    History   Chief Complaint Chief Complaint  Patient presents with  . Abdominal Pain    HPI [name] is a 78 y.o. female.     Patient was exerting herself doing yard work on Monday and Tuesday.  Wednesday morning she says she woke up with back stiffness and left lower quadrant abdominal pain at a 2 out of 10 intensity.  Today when she woke up her back pain was gone but her abdominal pain continued.  The pain has not gotten better or worse.  The pain 'comes and goes'.  She does not think it is an abdominal muscle strain. She says it is more consistent with her previous diverticulitis episodes. She was able to eat food yesterday but intentionally did not eat anything today.  She does not have nausea/vomiting.  She had 'a little' diarrhea, but not much more than usual. She says in previous episodes of diverticulitis she takes the medication for ten days while eating a soft diet and it gets better.  She says her grandchildren have been staying with her and eating times and types of foods has been more erratic, which she thinks may have triggered the abdmoinal pain.    The history is provided by the patient.  Abdominal Pain Pain location:  LLQ Pain quality: aching   Pain quality: not cramping and not sharp   Pain radiates to:  Does not radiate Pain severity:  Mild Onset quality:  Sudden Duration:  2 days Timing:  Intermittent Progression:  Unchanged Chronicity:  Recurrent Context: alcohol use   Context: not recent illness and not retching   Relieved by:  None tried Worsened by:  Nothing Ineffective treatments:  None tried Associated symptoms: diarrhea   Associated symptoms: no chest pain, no constipation, no dysuria, no fatigue, no fever, no hematemesis, no nausea and no sore throat   Risk factors: being elderly   Risk factors: no alcohol abuse     Past Medical  History:  Diagnosis Date  . Allergy    seasonal  . Arthritis   . [facility]  . Blood transfusion without reported diagnosis   . Cellulitis   . Diverticulitis    @ 3 yrs ago- treated with abx- no issues since   . GERD (gastroesophageal reflux disease)    no RX  . Osteopenia   . Osteoporosis     There are no active problems to display for this patient.   Past Surgical History:  Procedure Laterality Date  . ABDOMINAL HYSTERECTOMY    . ANTERIOR AND POSTERIOR REPAIR  07/25/2011   Procedure: ANTERIOR (CYSTOCELE) AND POSTERIOR REPAIR (RECTOCELE);  Surgeon: Darlyn Chamber, MD;  Location: [facility];  Service: Gynecology;  Laterality: N/A;  with Sacrospinous Ligament Suspension  . BLADDER SUSPENSION  07/25/2011   Procedure: TRANSVAGINAL TAPE (TVT) PROCEDURE;  Surgeon: Darlyn Chamber, MD;  Location: [facility];  Service: Gynecology;  Laterality: N/A;  Transobturator Sling  . COLONOSCOPY  08-12-2002   tics only  . CYSTOSCOPY  07/25/2011   Procedure: CYSTOSCOPY;  Surgeon: Darlyn Chamber, MD;  Location: [facility];  Service: Gynecology;  Laterality: N/A;  . LAPAROSCOPIC ASSISTED VAGINAL HYSTERECTOMY  07/25/2011   Procedure: LAPAROSCOPIC ASSISTED VAGINAL HYSTERECTOMY;  Surgeon: Darlyn Chamber, MD;  Location: [facility];  Service: Gynecology;  Laterality: N/A;  . LAPAROTOMY  1981  .  SALPINGOOPHORECTOMY  07/25/2011   Procedure: SALPINGO OOPHERECTOMY;  Surgeon: Darlyn Chamber, MD;  Location: [facility];  Service: Gynecology;  Laterality: Bilateral;  . TUBAL LIGATION       OB History   No obstetric history on file.      Home Medications    Prior to Admission medications   Medication Sig Start Date End Date Taking? Authorizing Provider  omeprazole (PRILOSEC) 20 MG capsule Take 20 mg by mouth daily.   Yes [provider]  amoxicillin-clavulanate (AUGMENTIN) 875-125 MG tablet Take 1 tablet by mouth 3 (three) times daily for 10 days. 11/21/18 12/01/18  Benay Pike, MD  cetirizine  (ZYRTEC) 10 MG tablet Take 1 tablet (10 mg total) by mouth daily. Patient not taking: Reported on 10/18/2016 12/22/15   Jaynee Eagles, PA-C  ciprofloxacin (CIPRO) 500 MG tablet Take 1 tablet (500 mg total) by mouth 2 (two) times daily. One po bid x 7 days 02/18/17   Margarita Mail, PA-C  fluconazole (DIFLUCAN) 150 MG tablet Take 1 tablet (150 mg total) by mouth every 3 (three) days. Repeat if needed 10/18/16   English, Stephanie D, PA  fluticasone (FLONASE) 50 MCG/ACT nasal spray Place 2 sprays into both nostrils daily. Patient not taking: Reported on 10/18/2016 12/22/15   Jaynee Eagles, PA-C  HYDROcodone-acetaminophen [facility]) 5-325 MG tablet Take 1-2 tablets by mouth every 6 (six) hours as needed for severe pain. 02/18/17   Harris, Abigail, PA-C  metroNIDAZOLE (FLAGYL) 500 MG tablet Take 1 tablet (500 mg total) by mouth 2 (two) times daily. One po bid x 7 days 02/18/17   Margarita Mail, PA-C  ondansetron (ZOFRAN) 4 MG tablet Take 1 tablet (4 mg total) by mouth every 8 (eight) hours as needed for nausea or vomiting. 02/18/17   Margarita Mail, PA-C  vitamin C (ASCORBIC ACID) 500 MG tablet Take 500 mg by mouth daily.    [provider]    Family History Family History  Adopted: Yes  Problem Relation Age of Onset  . Colon cancer Neg Hx   . Breast cancer Neg Hx     Social History Social History   Tobacco Use  . Smoking status: Former Research scientist (life sciences)  . Smokeless tobacco: Never Used  Substance Use Topics  . Alcohol use: Yes    Alcohol/week: 0.0 standard drinks    Comment: socially- daily has 2 drinks a day   . Drug use: No     Allergies   Tape   Review of Systems Review of Systems  Constitutional: Negative for fatigue and fever.  HENT: Negative for sore throat.   Cardiovascular: Negative for chest pain.  Gastrointestinal: Positive for abdominal pain and diarrhea. Negative for constipation, hematemesis and nausea.  Genitourinary: Negative for dysuria.  Musculoskeletal: Negative for  arthralgias and back pain.  Neurological: Negative for dizziness and headaches.  Psychiatric/Behavioral: Negative for dysphoric mood.     Physical Exam Updated Vital Signs BP 133/75 (BP Location: Right Arm)   Pulse 71   Temp 99.1 F (37.3 C) (Oral)   Resp 16   Ht 5\' 1"  (1.549 m)   Wt 60.3 kg   SpO2 97%   BMI 25.13 kg/m   Physical Exam Constitutional:      General: She is not in acute distress.    Appearance: She is not toxic-appearing.  HENT:     Head: Normocephalic and atraumatic.     Mouth/Throat:     Mouth: Mucous membranes are moist.     Pharynx: No pharyngeal  swelling or oropharyngeal exudate.  Eyes:     Extraocular Movements: Extraocular movements intact.  Cardiovascular:     Rate and Rhythm: Normal rate and regular rhythm.     Heart sounds: No murmur.  Pulmonary:     Effort: Pulmonary effort is normal. No respiratory distress.     Breath sounds: Normal breath sounds. No stridor. No rhonchi.  Abdominal:     General: Abdomen is flat. Bowel sounds are normal. There is no distension.     Palpations: Abdomen is soft.     Tenderness: There is abdominal tenderness in the left lower quadrant.     Hernia: No hernia is present.  Neurological:     Mental Status: She is alert and oriented to person, place, and time.      ED Treatments / Results  Labs (all labs ordered are listed, but only abnormal results are displayed) Labs Reviewed  URINALYSIS, ROUTINE W REFLEX MICROSCOPIC - Abnormal; Notable for the following components:      Result Value   APPearance HAZY (*)    Specific Gravity, Urine >1.030 (*)    All other components within normal limits  COMPREHENSIVE METABOLIC PANEL - Abnormal; Notable for the following components:   Glucose, Bld 127 (*)    All other components within normal limits  CBC WITH DIFFERENTIAL/PLATELET  LIPASE, BLOOD    EKG None  Radiology Ct Abdomen Pelvis W Contrast  Result Date: 11/21/2018 CLINICAL DATA:  Left lower quadrant  abdominal pain. History of diverticulitis. EXAM: CT ABDOMEN AND PELVIS WITH CONTRAST TECHNIQUE: Multidetector CT imaging of the abdomen and pelvis was performed using the standard protocol following bolus administration of intravenous contrast. CONTRAST:  157mL OMNIPAQUE IOHEXOL 300 MG/ML  SOLN COMPARISON:  CT scan dated 02/18/2017 FINDINGS: Lower chest: No acute abnormalities. Large chronic hiatal hernia. Hepatobiliary: Chronic hepatic steatosis. Focal fatty sparing adjacent to the gallbladder fossa. Biliary tree is normal. Pancreas: Unremarkable. No pancreatic ductal dilatation or surrounding inflammatory changes. Spleen: Normal in size without focal abnormality. Adrenals/Urinary Tract: Adrenal glands are normal. Stable 8 mm and 6 mm cysts in the lower pole of the otherwise normal left kidney. Right kidney is normal. No hydronephrosis. Bladder is normal. Stomach/Bowel: Acute sigmoid diverticulitis with mucosal edema and slight pericolonic soft tissue stranding in the mid sigmoid region. Multiple sigmoid diverticula. The inflammation extends adjacent to the dome of the bladder. No abscess or free air. Chronic large hiatal hernia. Stomach is otherwise normal. Small bowel is normal including the terminal ileum. Appendix is normal. Vascular/Lymphatic: Aortic atherosclerosis. Dual inferior vena cava. Reproductive: Status post hysterectomy. No adnexal masses. Other: No abdominal wall hernia or abnormality. No abdominopelvic ascites. Musculoskeletal: No acute abnormality. Degenerative disc and joint disease in the lower lumbar spine. IMPRESSION: 1. Acute sigmoid diverticulitis without abscess or free air. 2. Chronic hepatic steatosis. 3. Chronic large hiatal hernia. Electronically Signed   By: Lorriane Shire M.D.   On: 11/21/2018 10:53    Procedures Procedures (including critical care time)  Medications Ordered in ED Medications  iohexol (OMNIPAQUE) 300 MG/ML solution 100 mL (100 mLs Intravenous Contrast Given  11/21/18 1027)     Initial Impression / Assessment and Plan / ED Course  I have reviewed the triage vital signs and the nursing notes.  Pertinent labs & imaging results that were available during my care of the patient were reviewed by me and considered in my medical decision making (see chart for details).        Patient complaining of a 2-day  history of left lower quadrant abdominal pain that was consistent with previous episodes of diverticulitis.  She rates that as a 2/10 in intensity.  She had no vomiting with only mild diarrhea.  She was afebrile with no leukocytosis..  CT scan showed acute diverticulitis with chronic hiatal hernia and chronic hepatic steatosis.  Will treat patient with course of Augmentin and soft diet with instructions to return if it does not improve at the end of antibiotic course  Final Clinical Impressions(s) / ED Diagnoses   Final diagnoses:  Diverticulitis    ED Discharge Orders         Ordered    amoxicillin-clavulanate (AUGMENTIN) 875-125 MG tablet  3 times daily     11/21/18 1108           Benay Pike, MD 11/21/18 Mullins, Dan, DO 11/21/18 [phone]

## 2019-01-17 ENCOUNTER — Encounter: Payer: Self-pay | Admitting: Family Medicine

## 2019-01-17 ENCOUNTER — Ambulatory Visit (INDEPENDENT_AMBULATORY_CARE_PROVIDER_SITE_OTHER): Payer: Medicare Other | Admitting: Family Medicine

## 2019-01-17 ENCOUNTER — Other Ambulatory Visit: Payer: Self-pay

## 2019-01-17 VITALS — BP 138/84 | HR 73 | Temp 98.8°F | Ht 60.0 in | Wt 131.4 lb

## 2019-01-17 DIAGNOSIS — N3000 Acute cystitis without hematuria: Secondary | ICD-10-CM

## 2019-01-17 DIAGNOSIS — N3090 Cystitis, unspecified without hematuria: Secondary | ICD-10-CM

## 2019-01-17 DIAGNOSIS — R350 Frequency of micturition: Secondary | ICD-10-CM | POA: Diagnosis not present

## 2019-01-17 LAB — POCT URINALYSIS DIP (MANUAL ENTRY)
Bilirubin, UA: NEGATIVE
Glucose, UA: NEGATIVE mg/dL
Ketones, POC UA: NEGATIVE mg/dL
Nitrite, UA: NEGATIVE
Protein Ur, POC: NEGATIVE mg/dL
Spec Grav, UA: 1.015 (ref 1.010–1.025)
Urobilinogen, UA: 0.2 E.U./dL
pH, UA: 5.5 (ref 5.0–8.0)

## 2019-01-17 MED ORDER — CEPHALEXIN 500 MG PO CAPS: 500.0000 mg | ORAL_CAPSULE | Freq: Four times a day (QID) | ORAL | 0 refills | Status: DC

## 2019-01-17 NOTE — Patient Instructions (Addendum)
   Drink plenty of fluids  Take cephalexin 500 mg 1 twice daily for 5 days  Culture is pending  In the event of fever chills or getting worse get rechecked promptly.  If you have lab work done today you will be contacted with your lab results within the next 2 weeks.  If you have not heard from [place] then please contact us. The fastest way to get your results is to register for My Chart.   IF you received an x-ray today, you will receive an invoice from [facility] Rad[facility]. Please contact [facility] Radiology at [phone] with questions or concerns regarding your invoice.   IF you received labwork today, you will receive an invoice from [facility]. Please contact [facility] at [phone] with questions or concerns regarding your invoice.   Our billing staff will not be able to assist you with questions regarding bills from these companies.  You will be contacted with the lab results as soon as they are available. The fastest way to get your results is to activate your My Chart account. Instructions are located on the last page of this paperwork. If you have not heard from [place] regarding the results in 2 weeks, please contact this office.

## 2019-01-17 NOTE — Progress Notes (Signed)
Patient ID: [name], female    DOB: May 02, 1941  Age: 78 y.o. MRN: [id number]  Chief Complaint  Patient presents with  . possible UTI  . Urinary Frequency    started the begining of the week  . Urinary Urgency  . urinary burning    Subjective:   78 year old lady who has been having urinary frequency since early this week.  Knows of no cause.  It is been a long time since she has had a UTI.  She did have some diverticulitis earlier this summer and was on Augmentin for that.  She is widowed and not sexually involved.  She has been staying through [facility]. Results for orders placed or performed in visit on 01/17/19  POCT urinalysis dipstick  Result Value Ref Range   Color, UA yellow yellow   Clarity, UA cloudy (A) clear   Glucose, UA negative negative mg/dL   Bilirubin, UA negative negative   Ketones, POC UA negative negative mg/dL   Spec Grav, UA 1.015 1.010 - 1.025   Blood, UA moderate (A) negative   pH, UA 5.5 5.0 - 8.0   Protein Ur, POC negative negative mg/dL   Urobilinogen, UA 0.2 0.2 or 1.0 E.U./dL   Nitrite, UA Negative Negative   Leukocytes, UA Moderate (2+) (A) Negative    Current allergies, medications, problem list, past/family and social histories reviewed.  Objective:  BP 138/84 (BP Location: Left Arm, Patient Position: Sitting, Cuff Size: Normal)   Pulse 73   Temp 98.8 F (37.1 C) (Oral)   Ht 5' (1.524 m)   Wt 131 lb 6.4 oz (59.6 kg)   SpO2 98%   BMI 25.66 kg/m   No major distress.  No CVA tenderness.  Abdomen soft and nontender.  Assessment & Plan:   Assessment: 1. Urinary frequency   2. Cystitis       Plan: See instructions  Orders Placed This Encounter  Procedures  . Urine Culture  . POCT urinalysis dipstick    Meds ordered this encounter  Medications  . cephALEXin (KEFLEX) 500 MG capsule    Sig: Take 1 capsule (500 mg total) by mouth 4 (four) times daily.    Dispense:  10 capsule    Refill:  0         Patient Instructions      Drink plenty of fluids  Take cephalexin 500 mg 1 twice daily for 5 days  Culture is pending  In the event of fever chills or getting worse get rechecked promptly.  If you have lab work done today you will be contacted with your lab results within the next 2 weeks.  If you have not heard from [place] then please contact us. The fastest way to get your results is to register for My Chart.   IF you received an x-ray today, you will receive an invoice from [facility] Radiology. Please contact [facility] Radiology at [phone] with questions or concerns regarding your invoice.   IF you received labwork today, you will receive an invoice from [facility]. Please contact [facility] at [phone] with questions or concerns regarding your invoice.   Our billing staff will not be able to assist you with questions regarding bills from these companies.  You will be contacted with the lab results as soon as they are available. The fastest way to get your results is to activate your My Chart account. Instructions are located on the last page of this paperwork. If you have not heard from [place] regarding  the results in 2 weeks, please contact this office.         Return if symptoms worsen or fail to improve.   Ruben Reason, MD 01/17/2019

## 2019-01-18 LAB — URINE CULTURE: Organism ID, Bacteria: NO GROWTH

## 2019-03-05 ENCOUNTER — Other Ambulatory Visit: Payer: Self-pay | Admitting: Internal Medicine

## 2019-03-05 DIAGNOSIS — Z1231 Encounter for screening mammogram for malignant neoplasm of breast: Secondary | ICD-10-CM

## 2019-03-26 ENCOUNTER — Other Ambulatory Visit: Payer: Self-pay

## 2019-03-26 DIAGNOSIS — Z20822 Contact with and (suspected) exposure to covid-19: Secondary | ICD-10-CM

## 2019-03-27 LAB — NOVEL CORONAVIRUS, NAA: SARS-CoV-2, NAA: NOT DETECTED

## 2019-04-23 ENCOUNTER — Other Ambulatory Visit: Payer: Self-pay

## 2019-04-23 ENCOUNTER — Ambulatory Visit
Admission: RE | Admit: 2019-04-23 | Discharge: 2019-04-23 | Disposition: A | Discharge status: Home / Self Care | Payer: Medicare Other | Source: Ambulatory Visit | Attending: Internal Medicine | Admitting: Internal Medicine

## 2019-04-23 DIAGNOSIS — Z1231 Encounter for screening mammogram for malignant neoplasm of breast: Secondary | ICD-10-CM

## 2020-05-12 ENCOUNTER — Other Ambulatory Visit: Payer: Self-pay | Admitting: Internal Medicine

## 2020-05-12 DIAGNOSIS — M8589 Other specified disorders of bone density and structure, multiple sites: Secondary | ICD-10-CM

## 2020-05-12 DIAGNOSIS — Z1231 Encounter for screening mammogram for malignant neoplasm of breast: Secondary | ICD-10-CM

## 2020-07-01 ENCOUNTER — Other Ambulatory Visit: Payer: Self-pay

## 2020-07-01 ENCOUNTER — Ambulatory Visit
Admission: RE | Admit: 2020-07-01 | Discharge: 2020-07-01 | Disposition: A | Discharge status: Home / Self Care | Payer: Medicare Other | Source: Ambulatory Visit | Attending: Internal Medicine | Admitting: Internal Medicine

## 2020-07-01 DIAGNOSIS — Z1231 Encounter for screening mammogram for malignant neoplasm of breast: Secondary | ICD-10-CM

## 2020-08-17 ENCOUNTER — Ambulatory Visit: Payer: Medicare Other

## 2020-08-17 ENCOUNTER — Other Ambulatory Visit: Payer: Medicare Other

## 2020-08-21 ENCOUNTER — Encounter (HOSPITAL_BASED_OUTPATIENT_CLINIC_OR_DEPARTMENT_OTHER): Payer: Self-pay | Admitting: Emergency Medicine

## 2020-08-21 ENCOUNTER — Emergency Department (HOSPITAL_BASED_OUTPATIENT_CLINIC_OR_DEPARTMENT_OTHER)
Admission: EM | Admit: 2020-08-21 | Discharge: 2020-08-21 | Disposition: A | Discharge status: Home / Self Care | Payer: Medicare Other | Attending: Emergency Medicine | Admitting: Emergency Medicine

## 2020-08-21 ENCOUNTER — Emergency Department (HOSPITAL_BASED_OUTPATIENT_CLINIC_OR_DEPARTMENT_OTHER): Payer: Medicare Other

## 2020-08-21 ENCOUNTER — Other Ambulatory Visit: Payer: Self-pay

## 2020-08-21 DIAGNOSIS — R0789 Other chest pain: Secondary | ICD-10-CM | POA: Insufficient documentation

## 2020-08-21 DIAGNOSIS — Z87891 Personal history of nicotine dependence: Secondary | ICD-10-CM | POA: Diagnosis not present

## 2020-08-21 DIAGNOSIS — R079 Chest pain, unspecified: Secondary | ICD-10-CM

## 2020-08-21 LAB — BASIC METABOLIC PANEL
Anion gap: 10 (ref 5–15)
BUN: 12 mg/dL (ref 8–23)
CO2: 23 mmol/L (ref 22–32)
Calcium: 9 mg/dL (ref 8.9–10.3)
Chloride: 105 mmol/L (ref 98–111)
Creatinine, Ser: 0.95 mg/dL (ref 0.44–1.00)
GFR, Estimated: >60 mL/min (ref >60)
Glucose, Bld: 100 mg/dL — ABNORMAL HIGH (ref 70–99)
Potassium: 3.6 mmol/L (ref 3.5–5.1)
Sodium: 138 mmol/L (ref 135–145)

## 2020-08-21 LAB — CBC
HCT: 42.4 % (ref 36.0–46.0)
Hemoglobin: 13.6 g/dL (ref 12.0–15.0)
MCH: 26.9 pg (ref 26.0–34.0)
MCHC: 32.1 g/dL (ref 30.0–36.0)
MCV: 83.8 fL (ref 80.0–100.0)
Platelets: 201 10*3/uL (ref 150–400)
RBC: 5.06 MIL/uL (ref 3.87–5.11)
RDW: 15.3 % (ref 11.5–15.5)
WBC: 5.8 10*3/uL (ref 4.0–10.5)
nRBC: 0 % (ref 0.0–0.2)

## 2020-08-21 LAB — TROPONIN I (HIGH SENSITIVITY)
Troponin I (High Sensitivity): 3 ng/L (ref <18)
Troponin I (High Sensitivity): 3 ng/L (ref <18)

## 2020-08-21 NOTE — ED Triage Notes (Signed)
Reports pain across center of the chest that hurts to breathe.  Also endorses feeling like she has an earache on the right side.  Describes as a pressure.  Also endorses occasionally feeling tingling to entire top half of her body.

## 2020-08-21 NOTE — ED Provider Notes (Signed)
[facility] [facility] EMERGENCY DEPARTMENT Provider Note   CSN: [id number] Arrival date & time: 08/21/20  1729     History Chief Complaint  Patient presents with  . Chest Pain    [name] is a 80 y.o. female.  HPI      Presents with concern for chest pain, began a few hours ago Feels like a pressure in chest and into back, worse with deep breaths, not exertional, no weakness, no nausea/vomiting/dizziness/diaphoresis No shortness of breath but pain to take a deep breath Pain 2-3/10 No hx of pain like this before Is also worse with going to sit up or lay down/the movement of it but does not feel tender  No htn/dm/hlpd/known CAD No known family hx but doesn't know fam hx  No smoking/etoh/drugs (no etoh since October, 75yrs since smoked)    Past Medical History:  Diagnosis Date  . Allergy    seasonal  . Arthritis   . [facility]  . Blood transfusion without reported diagnosis   . Cellulitis   . Diverticulitis    @ 3 yrs ago- treated with abx- no issues since   . GERD (gastroesophageal reflux disease)    no RX  . Osteopenia   . Osteoporosis     There are no problems to display for this patient.   Past Surgical History:  Procedure Laterality Date  . ABDOMINAL HYSTERECTOMY    . ANTERIOR AND POSTERIOR REPAIR  07/25/2011   Procedure: ANTERIOR (CYSTOCELE) AND POSTERIOR REPAIR (RECTOCELE);  Surgeon: Darlyn Chamber, MD;  Location: [facility];  Service: Gynecology;  Laterality: N/A;  with Sacrospinous Ligament Suspension  . BLADDER SUSPENSION  07/25/2011   Procedure: TRANSVAGINAL TAPE (TVT) PROCEDURE;  Surgeon: Darlyn Chamber, MD;  Location: [facility];  Service: Gynecology;  Laterality: N/A;  Transobturator Sling  . COLONOSCOPY  08-12-2002   tics only  . CYSTOSCOPY  07/25/2011   Procedure: CYSTOSCOPY;  Surgeon: Darlyn Chamber, MD;  Location: [facility];  Service: Gynecology;  Laterality: N/A;  . LAPAROSCOPIC ASSISTED VAGINAL HYSTERECTOMY  07/25/2011    Procedure: LAPAROSCOPIC ASSISTED VAGINAL HYSTERECTOMY;  Surgeon: Darlyn Chamber, MD;  Location: [facility];  Service: Gynecology;  Laterality: N/A;  . LAPAROTOMY  1981  . SALPINGOOPHORECTOMY  07/25/2011   Procedure: SALPINGO OOPHERECTOMY;  Surgeon: Darlyn Chamber, MD;  Location: [facility];  Service: Gynecology;  Laterality: Bilateral;  . TUBAL LIGATION       OB History   No obstetric history on file.     Family History  Adopted: Yes  Problem Relation Age of Onset  . Colon cancer Neg Hx   . Breast cancer Neg Hx     Social History   Tobacco Use  . Smoking status: Former Research scientist (life sciences)  . Smokeless tobacco: Never Used  Substance Use Topics  . Alcohol use: Yes    Alcohol/week: 0.0 standard drinks    Comment: socially- daily has 2 drinks a day   . Drug use: No    Home Medications Prior to Admission medications   Medication Sig Start Date End Date Taking? Authorizing Provider  cephALEXin (KEFLEX) 500 MG capsule Take 1 capsule (500 mg total) by mouth 4 (four) times daily. 01/17/19   Posey Boyer, MD  cetirizine (ZYRTEC) 10 MG tablet Take 1 tablet (10 mg total) by mouth daily. 12/22/15   Jaynee Eagles, PA-C  cyanocobalamin 100 MCG tablet Take 100 mcg by mouth daily.    [provider]  ferrous sulfate 324 MG TBEC Take 324 mg by mouth.    [provider]  omeprazole (PRILOSEC) 20 MG capsule Take 20 mg by mouth daily.    [provider]    Allergies    Tape  Review of Systems   Review of Systems  Constitutional: Negative for fever.  HENT: Negative for sore throat.   Eyes: Negative for visual disturbance.  Respiratory: Negative for cough and shortness of breath.   Cardiovascular: Positive for chest pain.  Gastrointestinal: Negative for abdominal pain, nausea and vomiting.  Genitourinary: Negative for difficulty urinating.  Musculoskeletal: Negative for back pain and neck pain.  Skin: Negative for rash.  Neurological: Negative for syncope, light-headedness and  headaches.    Physical Exam Updated Vital Signs BP (!) 154/90 (BP Location: Right Arm)   Pulse 68   Temp 97.8 F (36.6 C) (Oral)   Resp 20   Ht 5\' 1"  (1.549 m)   Wt 54 kg   SpO2 100%   BMI 22.48 kg/m   Physical Exam Vitals and nursing note reviewed.  Constitutional:      General: She is not in acute distress.    Appearance: She is well-developed. She is not diaphoretic.  HENT:     Head: Normocephalic and atraumatic.  Eyes:     Conjunctiva/sclera: Conjunctivae normal.  Cardiovascular:     Rate and Rhythm: Normal rate and regular rhythm.     Heart sounds: Normal heart sounds. No murmur heard. No friction rub. No gallop.   Pulmonary:     Effort: Pulmonary effort is normal. No respiratory distress.     Breath sounds: Normal breath sounds. No wheezing or rales.  Abdominal:     General: There is no distension.     Palpations: Abdomen is soft.     Tenderness: There is no abdominal tenderness. There is no guarding.  Musculoskeletal:        General: No tenderness.     Cervical back: Normal range of motion.  Skin:    General: Skin is warm and dry.     Findings: No erythema or rash.  Neurological:     Mental Status: She is alert and oriented to person, place, and time.     ED Results / Procedures / Treatments   Labs (all labs ordered are listed, but only abnormal results are displayed) Labs Reviewed  BASIC METABOLIC PANEL - Abnormal; Notable for the following components:      Result Value   Glucose, Bld 100 (*)    All other components within normal limits  CBC  TROPONIN I (HIGH SENSITIVITY)  TROPONIN I (HIGH SENSITIVITY)    EKG EKG Interpretation  Date/Time:  Saturday August 21 2020 17:43:09 EDT Ventricular Rate:  64 PR Interval:  182 QRS Duration: 76 QT Interval:  384 QTC Calculation: 396 R Axis:   1 Text Interpretation: Normal sinus rhythm Nonspecific T wave abnormality Abnormal ECG No significant change since last tracing Confirmed by Gareth Morgan  [id number]) on 08/21/2020 6:22:36 PM   Radiology DG Chest 2 View  Result Date: 08/21/2020 CLINICAL DATA:  Chest pain EXAM: CHEST - 2 VIEW COMPARISON:  None. FINDINGS: The heart size and mediastinal contours are within normal limits. Both lungs are clear. The visualized skeletal structures are unremarkable. IMPRESSION: No active cardiopulmonary disease. Electronically Signed   By: Rolm Baptise M.D.   On: 08/21/2020 19:14    Procedures Procedures   Medications Ordered in ED Medications - No data to display  ED Course  I have reviewed the triage vital signs and the nursing notes.  Pertinent labs & imaging results that were available during my care of the patient were reviewed by me and considered in my medical decision making (see chart for details).    MDM Rules/Calculators/A&P                          80yo female with history of diverticulitis, GERD, osteoporosis presents with concern for chest pain.  Differential diagnosis for chest pain includes pulmonary embolus, dissection, pneumothorax, pneumonia, ACS, myocarditis, pericarditis.  EKG was done and evaluate by me and showed no acute ST changes and no signs of pericarditis. Chest x-ray was done and evaluated by me and radiology and showedno sign of pneumonia or pneumothorax. She has atypical chest pain and delta troponins were both negative and doubt ACS.  Discussed that we could order a ddimer to evaluate for risk, however a positive ddimer would necessitate transport to another facility as we do not currently have CT with contrast.  After discussion we decided to forego this testing. I have low clinical suspicion for dissection given description of pain, normal bilateral upper and lower extremity pulses, normal CXR.   Doubt PE given no hypoxia, no tachycardia, no dyspnea, no risk factors.  Possible MSK pain given worse with movements of sitting up. Recommend Cardiology and PCP follow up and discussed reasons to return.      Final Clinical  Impression(s) / ED Diagnoses Final diagnoses:  Nonspecific chest pain    Rx / DC Orders ED Discharge Orders    None       Gareth Morgan, MD 08/22/20 1025

## 2020-08-21 NOTE — ED Notes (Signed)
AVS reviewed with client, copy of AVS provided as well. Opportunity for questions provided, reinforced making follow up appt with cardiologist per ED Provider.

## 2020-10-01 ENCOUNTER — Ambulatory Visit (INDEPENDENT_AMBULATORY_CARE_PROVIDER_SITE_OTHER): Payer: Medicare Other | Admitting: Cardiology

## 2020-10-01 ENCOUNTER — Encounter: Payer: Self-pay | Admitting: Cardiology

## 2020-10-01 ENCOUNTER — Other Ambulatory Visit: Payer: Self-pay

## 2020-10-01 ENCOUNTER — Ambulatory Visit: Payer: Medicare Other | Admitting: Cardiology

## 2020-10-01 VITALS — BP 128/88 | HR 70 | Ht 60.5 in | Wt 121.4 lb

## 2020-10-01 DIAGNOSIS — R072 Precordial pain: Secondary | ICD-10-CM | POA: Diagnosis not present

## 2020-10-01 DIAGNOSIS — R079 Chest pain, unspecified: Secondary | ICD-10-CM | POA: Diagnosis not present

## 2020-10-01 NOTE — Addendum Note (Signed)
Addended by: Antonieta Iba on: 10/01/2020 01:17 PM   Modules accepted: Orders

## 2020-10-01 NOTE — Addendum Note (Signed)
Addended by: Antonieta Iba on: 10/01/2020 01:14 PM   Modules accepted: Orders

## 2020-10-01 NOTE — Progress Notes (Signed)
Cardiology Consult Note    Date:  10/01/2020   ID:  Faye Ramsay, DOB [birth year], MRN [id number]  PCP:  Michael Boston, MD  Cardiologist:  Fransico Him, MD   Chief Complaint  Patient presents with  . New Patient (Initial Visit)    Chest pain    History of Present Illness:  [name] is a 80 y.o. female who is being seen today for the evaluation of chest pain at the request of Wile, Jesse Sans, MD.  This is a 80yo female with a history of GERD who is referred for evaluation of chest pain. She tells me that she developed some chest tightness while reading located centrally but no radiation but did have SOB with it.  There was no associated sx of nausea or diaphoresis.  She got concerned since she lives alone and went to [facility] and hsTrop was normal x 3 and all other labs were normal.  EKG showed NSR with nonspecific T wave abnormality.  She has not had any further CP since then.  She denies any DOE, PND, orthopnea, LE edema, palpitations, dizziness or syncope. She tells me that she did smoke in the past but it was 40 years ago.  She does not know her family hx as she was adopted.    Past Medical History:  Diagnosis Date  . Allergy    seasonal  . Arthritis   . [facility]  . Blood transfusion without reported diagnosis   . Cellulitis   . Diverticulitis    @ 3 yrs ago- treated with abx- no issues since   . GERD (gastroesophageal reflux disease)    no RX  . Osteopenia   . Osteoporosis     Past Surgical History:  Procedure Laterality Date  . ABDOMINAL HYSTERECTOMY    . ANTERIOR AND POSTERIOR REPAIR  07/25/2011   Procedure: ANTERIOR (CYSTOCELE) AND POSTERIOR REPAIR (RECTOCELE);  Surgeon: Darlyn Chamber, MD;  Location: [facility];  Service: Gynecology;  Laterality: N/A;  with Sacrospinous Ligament Suspension  . BLADDER SUSPENSION  07/25/2011   Procedure: TRANSVAGINAL TAPE (TVT) PROCEDURE;  Surgeon: Darlyn Chamber, MD;  Location: [facility];  Service:  Gynecology;  Laterality: N/A;  Transobturator Sling  . COLONOSCOPY  08-12-2002   tics only  . CYSTOSCOPY  07/25/2011   Procedure: CYSTOSCOPY;  Surgeon: Darlyn Chamber, MD;  Location: [facility];  Service: Gynecology;  Laterality: N/A;  . LAPAROSCOPIC ASSISTED VAGINAL HYSTERECTOMY  07/25/2011   Procedure: LAPAROSCOPIC ASSISTED VAGINAL HYSTERECTOMY;  Surgeon: Darlyn Chamber, MD;  Location: [facility];  Service: Gynecology;  Laterality: N/A;  . LAPAROTOMY  1981  . SALPINGOOPHORECTOMY  07/25/2011   Procedure: SALPINGO OOPHERECTOMY;  Surgeon: Darlyn Chamber, MD;  Location: [facility];  Service: Gynecology;  Laterality: Bilateral;  . TUBAL LIGATION      Current Medications: Current Meds  Medication Sig  . Biotin 5000 MCG CAPS Take 5,000 mcg by mouth daily.  . Cholecalciferol (VITAMIN D3 PO) Take 1,000 Units by mouth daily.  Marland Kitchen loratadine (CLARITIN) 10 MG tablet Take 10 mg by mouth daily.  Marland Kitchen omeprazole (PRILOSEC) 20 MG capsule Take 20 mg by mouth daily.    Allergies:   Tape   Social History   Socioeconomic History  . Marital status: Married    Spouse name: Not on file  . Number of children: Not on file  . Years of education: Not on file  . Highest education  level: Not on file  Occupational History  . Not on file  Tobacco Use  . Smoking status: Former Research scientist (life sciences)  . Smokeless tobacco: Never Used  Substance and Sexual Activity  . Alcohol use: Yes    Alcohol/week: 0.0 standard drinks    Comment: socially- daily has 2 drinks a day   . Drug use: No  . Sexual activity: Not on file  Other Topics Concern  . Not on file  Social History Narrative  . Not on file   Social Determinants of Health   Financial Resource Strain: Not on file  Food Insecurity: Not on file  Transportation Needs: Not on file  Physical Activity: Not on file  Stress: Not on file  Social Connections: Not on file     Family History:  The patient's family history is not on file. She was adopted.   ROS:   Please see the history of  present illness.    ROS All other systems reviewed and are negative.  No flowsheet data found.     PHYSICAL EXAM:   VS:  BP 128/88   Pulse 70   Ht 5' 0.5" (1.537 m)   Wt 121 lb 6.4 oz (55.1 kg)   SpO2 90%   BMI 23.32 kg/m    GEN: Well nourished, well developed, in no acute distress  HEENT: normal  Neck: no JVD, carotid bruits, or masses Cardiac: RRR; no murmurs, rubs, or gallops,no edema.  Intact distal pulses bilaterally.  Respiratory:  clear to auscultation bilaterally, normal work of breathing GI: soft, nontender, nondistended, + BS MS: no deformity or atrophy  Skin: warm and dry, no rash Neuro:  Alert and Oriented x 3, Strength and sensation are intact Psych: euthymic mood, full affect  Wt Readings from Last 3 Encounters:  10/01/20 121 lb 6.4 oz (55.1 kg)  08/21/20 119 lb (54 kg)  01/17/19 131 lb 6.4 oz (59.6 kg)      Studies/Labs Reviewed:   EKG:  EKG is not ordered today.    Recent Labs: 08/21/2020: BUN 12; Creatinine, Ser 0.95; Hemoglobin 13.6; Platelets 201; Potassium 3.6; Sodium 138   Lipid Panel No results found for: CHOL, TRIG, HDL, CHOLHDL, VLDL, LDLCALC, LDLDIRECT     Additional studies/ records that were reviewed today include:  OV notes from PCP    ASSESSMENT:    1. Chest pain of uncertain etiology      PLAN:  In order of problems listed above:  1. Chest pain -her pain is somewhat atypical in that it lasted for several hours with normal hsTrop -her pain also occurred at rest and was no associated with any other sx -she was a smoker and quit 40 years ago -she does not know her family hx as she was adopted -her EKG in ER showed non specific T wave abnormality -I will get a stress myoview to rule out ischemia -Shared Decision Making/Informed Consent The risks [chest pain, shortness of breath, cardiac arrhythmias, dizziness, blood pressure fluctuations, myocardial infarction, stroke/transient ischemic attack, nausea, vomiting, allergic  reaction, radiation exposure, metallic taste sensation and life-threatening complications (estimated to be 1 in 10,000)], benefits (risk stratification, diagnosing coronary artery disease, treatment guidance) and alternatives of a nuclear stress test were discussed in detail with [name] and she agrees to proceed. -I will also get a coronary Ca score to assess cardiac risk  Medication Adjustments/Labs and Tests Ordered: Current medicines are reviewed at length with the patient today.  Concerns regarding medicines are outlined above.  Medication  changes, Labs and Tests ordered today are listed in the Patient Instructions below.  There are no Patient Instructions on file for this visit.   Signed, Fransico Him, MD  10/01/2020 1:06 PM    [facility] [place], [place], [place]  [address] Phone: [phone]; Fax: [phone]

## 2020-10-01 NOTE — Addendum Note (Signed)
Addended by: Fransico Him R on: 10/01/2020 01:35 PM   Modules accepted: Orders

## 2020-10-01 NOTE — Patient Instructions (Signed)
Medication Instructions:  Your physician recommends that you continue on your current medications as directed. Please refer to the Current Medication list given to you today.  *If you need a refill on your cardiac medications before your next appointment, please call your pharmacy*   Testing/Procedures: Your provider has recommended that you have calcium score CT scan.   Your provider has recommended that you have a stress myoview test.    Follow-Up: At [facility], you and your health needs are our priority.  As part of our continuing mission to provide you with exceptional heart care, we have created designated Provider Care Teams.  These Care Teams include your primary Cardiologist (physician) and Advanced Practice Providers (APPs -  Physician Assistants and Nurse Practitioners) who all work together to provide you with the care you need, when you need it.  Follow up with Dr. Radford Pax as needed based on results of testing.

## 2020-10-07 ENCOUNTER — Telehealth (HOSPITAL_COMMUNITY): Payer: Self-pay | Admitting: *Deleted

## 2020-10-07 NOTE — Telephone Encounter (Signed)
Patient given detailed instructions per Myocardial Perfusion Study Information Sheet for the test on 10/13/20. Patient notified to arrive 15 minutes early and that it is imperative to arrive on time for appointment to keep from having the test rescheduled.  If you need to cancel or reschedule your appointment, please call the office within 24 hours of your appointment. . Patient verbalized understanding. [name]    

## 2020-10-13 ENCOUNTER — Other Ambulatory Visit: Payer: Self-pay

## 2020-10-13 ENCOUNTER — Ambulatory Visit (HOSPITAL_COMMUNITY): Payer: Medicare Other | Attending: Cardiovascular Disease

## 2020-10-13 ENCOUNTER — Telehealth: Payer: Self-pay

## 2020-10-13 ENCOUNTER — Encounter: Payer: Self-pay | Admitting: Cardiology

## 2020-10-13 ENCOUNTER — Ambulatory Visit (INDEPENDENT_AMBULATORY_CARE_PROVIDER_SITE_OTHER)
Admission: RE | Admit: 2020-10-13 | Discharge: 2020-10-13 | Disposition: A | Discharge status: Home / Self Care | Payer: Self-pay | Source: Ambulatory Visit | Attending: Cardiology | Admitting: Cardiology

## 2020-10-13 DIAGNOSIS — I700 Atherosclerosis of aorta: Secondary | ICD-10-CM

## 2020-10-13 DIAGNOSIS — R072 Precordial pain: Secondary | ICD-10-CM | POA: Diagnosis present

## 2020-10-13 DIAGNOSIS — R931 Abnormal findings on diagnostic imaging of heart and coronary circulation: Secondary | ICD-10-CM | POA: Insufficient documentation

## 2020-10-13 DIAGNOSIS — R079 Chest pain, unspecified: Secondary | ICD-10-CM

## 2020-10-13 LAB — MYOCARDIAL PERFUSION IMAGING
LV dias vol: 29 mL (ref 46–106)
LV sys vol: 7 mL
Peak HR: 126 {beats}/min
Rest HR: 62 {beats}/min
SDS: 1
SRS: 0
SSS: 1
TID: 0.58

## 2020-10-13 MED ORDER — TECHNETIUM TC 99M TETROFOSMIN IV KIT
31.6000 | PACK | Freq: Once | INTRAVENOUS | Status: AC | PRN
  Administered 2020-10-13: 31.6 via INTRAVENOUS
  Filled 2020-10-13: qty 32

## 2020-10-13 MED ORDER — TECHNETIUM TC 99M TETROFOSMIN IV KIT
10.3000 | PACK | Freq: Once | INTRAVENOUS | Status: AC | PRN
  Administered 2020-10-13: 10.3 via INTRAVENOUS
  Filled 2020-10-13: qty 11

## 2020-10-13 NOTE — Telephone Encounter (Signed)
The patient has been notified of the result and verbalized understanding.  All questions (if any) were answered. Antonieta Iba, RN 10/13/2020 1:28 PM

## 2020-10-13 NOTE — Telephone Encounter (Signed)
-----   Message from Sueanne Margarita, MD sent at 10/13/2020  9:58 AM EDT ----- Chest CT with large hiatal hernia. Aortic atherosclerosis.  Very small punctate Ca in the mid LAD with very low coronary Ca score os 0.84 which is very low risk.  Please get a copy of last FLP

## 2020-10-19 ENCOUNTER — Other Ambulatory Visit: Payer: Medicare Other | Admitting: *Deleted

## 2020-10-19 ENCOUNTER — Other Ambulatory Visit: Payer: Self-pay

## 2020-10-19 DIAGNOSIS — I700 Atherosclerosis of aorta: Secondary | ICD-10-CM

## 2020-10-19 LAB — LIPID PANEL
Chol/HDL Ratio: 3.6 ratio (ref 0.0–4.4)
Cholesterol, Total: 208 mg/dL — ABNORMAL HIGH (ref 100–199)
HDL: 58 mg/dL (ref >39)
LDL Chol Calc (NIH): 129 mg/dL — ABNORMAL HIGH (ref 0–99)
Triglycerides: 121 mg/dL (ref 0–149)
VLDL Cholesterol Cal: 21 mg/dL (ref 5–40)

## 2020-10-22 ENCOUNTER — Telehealth: Payer: Self-pay

## 2020-10-22 DIAGNOSIS — I700 Atherosclerosis of aorta: Secondary | ICD-10-CM

## 2020-10-22 MED ORDER — ATORVASTATIN CALCIUM 20 MG PO TABS: 20.0000 mg | ORAL_TABLET | Freq: Every day | ORAL | 3 refills | Status: DC

## 2020-10-22 NOTE — Telephone Encounter (Signed)
-----   Message from Sueanne Margarita, MD sent at 10/20/2020  9:05 PM EDT ----- LDL goal < 70 with coronary Ca - start Atorvastatin 20mg  daily and repeat FLP and ALT in 6 weeks

## 2020-10-22 NOTE — Telephone Encounter (Signed)
The patient has been notified of the result and verbalized understanding.  All questions (if any) were answered. Antonieta Iba, RN 10/22/2020 8:38 AM

## 2020-11-12 ENCOUNTER — Ambulatory Visit
Admission: RE | Admit: 2020-11-12 | Discharge: 2020-11-12 | Disposition: A | Discharge status: Home / Self Care | Payer: Medicare Other | Source: Ambulatory Visit | Attending: Internal Medicine | Admitting: Internal Medicine

## 2020-11-12 ENCOUNTER — Other Ambulatory Visit: Payer: Self-pay

## 2020-11-12 DIAGNOSIS — M8589 Other specified disorders of bone density and structure, multiple sites: Secondary | ICD-10-CM

## 2020-12-06 ENCOUNTER — Ambulatory Visit (HOSPITAL_COMMUNITY): Payer: Medicare Other

## 2020-12-08 ENCOUNTER — Other Ambulatory Visit: Payer: Medicare Other

## 2020-12-08 ENCOUNTER — Other Ambulatory Visit: Payer: Self-pay

## 2020-12-08 DIAGNOSIS — I700 Atherosclerosis of aorta: Secondary | ICD-10-CM

## 2020-12-08 LAB — LIPID PANEL
Chol/HDL Ratio: 2.4 ratio (ref 0.0–4.4)
Cholesterol, Total: 127 mg/dL (ref 100–199)
HDL: 54 mg/dL (ref >39)
LDL Chol Calc (NIH): 50 mg/dL (ref 0–99)
Triglycerides: 133 mg/dL (ref 0–149)
VLDL Cholesterol Cal: 23 mg/dL (ref 5–40)

## 2020-12-08 LAB — ALT: ALT: 9 IU/L (ref 0–32)

## 2020-12-10 DIAGNOSIS — I700 Atherosclerosis of aorta: Secondary | ICD-10-CM

## 2020-12-10 DIAGNOSIS — R931 Abnormal findings on diagnostic imaging of heart and coronary circulation: Secondary | ICD-10-CM

## 2020-12-14 ENCOUNTER — Other Ambulatory Visit (HOSPITAL_COMMUNITY): Payer: Self-pay | Admitting: *Deleted

## 2020-12-16 ENCOUNTER — Other Ambulatory Visit: Payer: Self-pay

## 2020-12-16 ENCOUNTER — Ambulatory Visit (HOSPITAL_COMMUNITY)
Admission: RE | Admit: 2020-12-16 | Discharge: 2020-12-16 | Disposition: A | Discharge status: Home / Self Care | Payer: Medicare Other | Source: Ambulatory Visit | Attending: Internal Medicine | Admitting: Internal Medicine

## 2020-12-16 DIAGNOSIS — M810 Age-related osteoporosis without current pathological fracture: Secondary | ICD-10-CM | POA: Insufficient documentation

## 2020-12-16 MED ORDER — ZOLEDRONIC ACID 5 MG/100ML IV SOLN: 5.0000 mg | Freq: Once | INTRAVENOUS | Status: DC

## 2020-12-16 MED ORDER — ZOLEDRONIC ACID 5 MG/100ML IV SOLN
INTRAVENOUS | Status: AC
  Administered 2020-12-16: 5 mg
  Filled 2020-12-16: qty 100

## 2020-12-28 ENCOUNTER — Ambulatory Visit (INDEPENDENT_AMBULATORY_CARE_PROVIDER_SITE_OTHER): Payer: Medicare Other | Admitting: Pharmacist

## 2020-12-28 ENCOUNTER — Other Ambulatory Visit: Payer: Self-pay

## 2020-12-28 DIAGNOSIS — I700 Atherosclerosis of aorta: Secondary | ICD-10-CM

## 2020-12-28 MED ORDER — ROSUVASTATIN CALCIUM 10 MG PO TABS: 10.0000 mg | ORAL_TABLET | Freq: Every day | ORAL | 5 refills | Status: DC

## 2020-12-28 NOTE — Patient Instructions (Addendum)
Your LDL goal is < 70  Stop atorvastatin 20g daily and monitor for muscle ache improvement  Start rosuvastatin '10mg'$  daily  Call Jinny Blossom, PharmD with any trouble tolerating your medication. We can decrease the dose to '5mg'$  or you can take it every other day instead [phone]  Recheck fasting cholesterol in 2 months on Monday, October 10th any time after 7:30am

## 2020-12-28 NOTE — Progress Notes (Signed)
Patient ID: [name]                 DOB: 10/02/1940                    MRN: [id number]     HPI: [name] is a 80 y.o. female patient referred to lipid clinic by Dr Radford Pax. PMH is significant for GERD, referred to cardiology earlier this year by PCP due to chest pain. She underwent stress test 09/2020 which was low risk, EF 74%. Coronary calcium score was 0.84 (18th percentile for age and sex) with single faint punctate area of calcium noted in the mid LAD, atherosclerosis of the descending thoracic aorta and stable large hiatal hernia. She was started on atorvastatin '20mg'$  daily. About 6 weeks later, pt sent MyChart message reporting muscle pain and trouble sleeping. She was referred to lipid clinic to discuss other options.  Pt presents today in good spirits. Reports experiencing muscle pain in her calves, arms, and back on atorvastatin. Symptoms began about 1 week after statin therapy. She tried changing the time of day that she took atorvastatin which did not help. Has not taken other meds for her cholesterol in the past.  Current Medications: atorvastatin '20mg'$  daily Intolerances: atorvastatin '20mg'$  daily - muscle pain Risk Factors: elevated calcium score, age LDL goal: '70mg'$ /dL  Diet: Rarely eats red meat, reads nutrition labels  Ex[facility], hasn't been doing as much now  Family History: Unknown - adopted.  Social History: Former tobacco use 40 years ago  Labs: 10/19/20: TC 208, TG 121, HDL 58, LDL 129 (no LLT) 12/08/20: TC 127, TG 133, HDL 54, LDL 50 (atorvastatin '20mg'$  daily)  Past Medical History:  Diagnosis Date   Agatston coronary artery calcium score less than 100    0.84 on Chest CT 09/2020   Allergy    seasonal   Aortic atherosclerosis ([place])    Arthritis    [facility]   Blood transfusion without reported diagnosis    Cellulitis    Diverticulitis    @ 3 yrs ago- treated with abx- no issues since    GERD  (gastroesophageal reflux disease)    no RX   Osteopenia    Osteoporosis     Current Outpatient Medications on File Prior to Visit  Medication Sig Dispense Refill   atorvastatin (LIPITOR) 20 MG tablet Take 1 tablet (20 mg total) by mouth daily. 90 tablet 3   Biotin 5000 MCG CAPS Take 5,000 mcg by mouth daily.     Cholecalciferol (VITAMIN D3 PO) Take 1,000 Units by mouth daily.     loratadine (CLARITIN) 10 MG tablet Take 10 mg by mouth daily.     omeprazole (PRILOSEC) 20 MG capsule Take 20 mg by mouth daily.     No current facility-administered medications on file prior to visit.    Allergies  Allergen Reactions   Tape     Assessment/Plan:  1. Hyperlipidemia - LDL responded well and improved from baseline of 129 to 50 after starting atorvastatin '20mg'$  daily, however pt has not tolerated therapy secondary to myalgias. LDL goal < 70 due to slight calcium score elevation. Advised pt to stop atorvastatin and monitor for symptom resolution over the next week, then start rosuvastatin '10mg'$  daily. Will recheck lipids in 2 months. Pt advised to call clinic if she experiences any side effects. Can decrease dose to '5mg'$  daily or change frequency to  $'10mg'A$  every other day if needed.  Tracey Hermance E. Dymir Neeson, PharmD, BCACP, [place] [id number], [place], [place] [address] Phone: [phone]; Fax: [phone] 12/28/2020 3:15 PM

## 2021-02-28 ENCOUNTER — Other Ambulatory Visit: Payer: Medicare Other | Admitting: *Deleted

## 2021-02-28 ENCOUNTER — Other Ambulatory Visit: Payer: Self-pay

## 2021-02-28 DIAGNOSIS — I700 Atherosclerosis of aorta: Secondary | ICD-10-CM

## 2021-02-28 LAB — LIPID PANEL
Chol/HDL Ratio: 2.1 ratio (ref 0.0–4.4)
Cholesterol, Total: 125 mg/dL (ref 100–199)
HDL: 60 mg/dL (ref >39)
LDL Chol Calc (NIH): 47 mg/dL (ref 0–99)
Triglycerides: 97 mg/dL (ref 0–149)
VLDL Cholesterol Cal: 18 mg/dL (ref 5–40)

## 2021-02-28 LAB — HEPATIC FUNCTION PANEL
ALT: 7 IU/L (ref 0–32)
AST: 17 IU/L (ref 0–40)
Albumin: 4.2 g/dL (ref 3.7–4.7)
Alkaline Phosphatase: 62 IU/L (ref 44–121)
Bilirubin Total: 0.3 mg/dL (ref 0.0–1.2)
Bilirubin, Direct: <0.1 mg/dL (ref 0.00–0.40)
Total Protein: 6.5 g/dL (ref 6.0–8.5)

## 2021-07-19 ENCOUNTER — Other Ambulatory Visit: Payer: Self-pay | Admitting: Cardiology

## 2021-07-27 ENCOUNTER — Other Ambulatory Visit: Payer: Self-pay | Admitting: Cardiology

## 2021-10-25 ENCOUNTER — Telehealth: Payer: Self-pay | Admitting: Cardiology

## 2021-10-25 DIAGNOSIS — Z79899 Other long term (current) drug therapy: Secondary | ICD-10-CM

## 2021-10-25 DIAGNOSIS — I700 Atherosclerosis of aorta: Secondary | ICD-10-CM

## 2021-10-25 NOTE — Telephone Encounter (Signed)
Patient calling to see if she needs to have labs done before her appt. Please advise

## 2021-10-25 NOTE — Telephone Encounter (Signed)
Spoke with patient, she called to see if Dr. Radford Pax would like any labs drawn prior to appt with her on 01/12/22.  Patient states she has enough rosuvastatin for another month, but will need a refill in July to cover her until appt with Dr. Radford Pax in August.  Will forward to Dr. Radford Pax to advise on labs.

## 2021-10-26 MED ORDER — ROSUVASTATIN CALCIUM 10 MG PO TABS: ORAL_TABLET | ORAL | 2 refills | Status: DC

## 2021-10-26 NOTE — Telephone Encounter (Signed)
Per Dr. Radford Pax, patient to have fasting lipid and hepatic panel drawn prior to office visit on 01/12/22. Lab appt scheduled for 01/06/22.  Patient states she will need a refill of Rosuvastatin to cover her until her appt with Dr. Radford Pax in August. Refill for Rosuvastatin sent to pharmacy of choice.

## 2021-10-31 ENCOUNTER — Other Ambulatory Visit: Payer: Self-pay | Admitting: Internal Medicine

## 2021-10-31 DIAGNOSIS — Z1231 Encounter for screening mammogram for malignant neoplasm of breast: Secondary | ICD-10-CM

## 2021-11-02 ENCOUNTER — Ambulatory Visit
Admission: RE | Admit: 2021-11-02 | Discharge: 2021-11-02 | Disposition: A | Discharge status: Home / Self Care | Payer: Medicare Other | Source: Ambulatory Visit | Attending: Internal Medicine | Admitting: Internal Medicine

## 2021-11-02 DIAGNOSIS — Z1231 Encounter for screening mammogram for malignant neoplasm of breast: Secondary | ICD-10-CM

## 2021-12-19 ENCOUNTER — Other Ambulatory Visit (HOSPITAL_COMMUNITY): Payer: Self-pay | Admitting: *Deleted

## 2021-12-21 ENCOUNTER — Ambulatory Visit (HOSPITAL_COMMUNITY)
Admission: RE | Admit: 2021-12-21 | Discharge: 2021-12-21 | Disposition: A | Discharge status: Home / Self Care | Payer: Medicare Other | Source: Ambulatory Visit | Attending: Internal Medicine | Admitting: Internal Medicine

## 2021-12-21 DIAGNOSIS — M810 Age-related osteoporosis without current pathological fracture: Secondary | ICD-10-CM | POA: Diagnosis present

## 2021-12-21 MED ORDER — ZOLEDRONIC ACID 5 MG/100ML IV SOLN
INTRAVENOUS | Status: AC
  Administered 2021-12-21: 5 mg via INTRAVENOUS
  Filled 2021-12-21: qty 100

## 2021-12-21 MED ORDER — ZOLEDRONIC ACID 5 MG/100ML IV SOLN: 5.0000 mg | Freq: Once | INTRAVENOUS | Status: AC

## 2022-01-06 ENCOUNTER — Other Ambulatory Visit: Payer: Medicare Other

## 2022-01-06 DIAGNOSIS — I700 Atherosclerosis of aorta: Secondary | ICD-10-CM

## 2022-01-06 DIAGNOSIS — Z79899 Other long term (current) drug therapy: Secondary | ICD-10-CM

## 2022-01-06 LAB — LIPID PANEL
Chol/HDL Ratio: 1.9 ratio (ref 0.0–4.4)
Cholesterol, Total: 105 mg/dL (ref 100–199)
HDL: 56 mg/dL (ref >39)
LDL Chol Calc (NIH): 31 mg/dL (ref 0–99)
Triglycerides: 98 mg/dL (ref 0–149)
VLDL Cholesterol Cal: 18 mg/dL (ref 5–40)

## 2022-01-06 LAB — HEPATIC FUNCTION PANEL
ALT: 10 IU/L (ref 0–32)
AST: 18 IU/L (ref 0–40)
Albumin: 4.3 g/dL (ref 3.8–4.8)
Alkaline Phosphatase: 64 IU/L (ref 44–121)
Bilirubin Total: 0.3 mg/dL (ref 0.0–1.2)
Bilirubin, Direct: 0.11 mg/dL (ref 0.00–0.40)
Total Protein: 6.4 g/dL (ref 6.0–8.5)

## 2022-01-12 ENCOUNTER — Encounter: Payer: Self-pay | Admitting: Cardiology

## 2022-01-12 ENCOUNTER — Ambulatory Visit: Payer: Medicare Other | Admitting: Cardiology

## 2022-01-12 VITALS — BP 136/68 | HR 71 | Ht 60.5 in | Wt 124.0 lb

## 2022-01-12 DIAGNOSIS — E7800 Pure hypercholesterolemia, unspecified: Secondary | ICD-10-CM

## 2022-01-12 DIAGNOSIS — R931 Abnormal findings on diagnostic imaging of heart and coronary circulation: Secondary | ICD-10-CM

## 2022-01-12 MED ORDER — ROSUVASTATIN CALCIUM 10 MG PO TABS: ORAL_TABLET | ORAL | 3 refills | Status: DC

## 2022-01-12 NOTE — Patient Instructions (Signed)
Medication Instructions:  Your physician recommends that you continue on your current medications as directed. Please refer to the Current Medication list given to you today.  *If you need a refill on your cardiac medications before your next appointment, please call your pharmacy*   Lab Work: None ordered  If you have labs (blood work) drawn today and your tests are completely normal, you will receive your results only by: [place] (if you have MyChart) OR A paper copy in the mail If you have any lab test that is abnormal or we need to change your treatment, we will call you to review the results.   Testing/Procedures: None ordered   Follow-Up: At [facility], you and your health needs are our priority.  As part of our continuing mission to provide you with exceptional heart care, we have created designated Provider Care Teams.  These Care Teams include your primary Cardiologist (physician) and Advanced Practice Providers (APPs -  Physician Assistants and Nurse Practitioners) who all work together to provide you with the care you need, when you need it.  We recommend signing up for the patient portal called "MyChart".  Sign up information is provided on this After Visit Summary.  MyChart is used to connect with patients for Virtual Visits (Telemedicine).  Patients are able to view lab/test results, encounter notes, upcoming appointments, etc.  Non-urgent messages can be sent to your provider as well.   To learn more about what you can do with MyChart, go to [email or web address].    Your next appointment:   1 year(s)  The format for your next appointment:   In Person  Provider:   Fransico Him, MD     Other Instructions   Important Information About [place]

## 2022-01-12 NOTE — Addendum Note (Signed)
Addended by: Gaetano Net on: 01/12/2022 09:51 AM   Modules accepted: Orders

## 2022-01-12 NOTE — Progress Notes (Signed)
Cardiology Office  Note    Date:  01/12/2022   ID:  Norlene, Lanes 02/01/1941, MRN [id number]  PCP:  Michael Boston, MD  Cardiologist:  Fransico Him, MD   Chief Complaint  Patient presents with   Coronary Artery Disease   Hyperlipidemia    History of Present Illness:  [name] is a 81 y.o. female  with a history of GERD who was referred for evaluation of chest pain.A coronary Ca score of 0.84 and nuclear stress test was normal.  She is here today for followup and is doing well.  She has had some problems with anemia recently and has been on Iron and B12 injections. She denies any chest pain or pressure, SOB, DOE (except when she was anemic), PND, orthopnea, LE edema, dizziness, palpitations or syncope. She is compliant with her meds and is tolerating meds with no SE.    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   Blood transfusion without reported diagnosis    Cellulitis    Diverticulitis    @ 3 yrs ago- treated with abx- no issues since    GERD (gastroesophageal reflux disease)    no RX   Osteopenia    Osteoporosis     Past Surgical History:  Procedure Laterality Date   ABDOMINAL HYSTERECTOMY     ANTERIOR AND POSTERIOR REPAIR  07/25/2011   Procedure: ANTERIOR (CYSTOCELE) AND POSTERIOR REPAIR (RECTOCELE);  Surgeon: Darlyn Chamber, MD;  Location: [facility];  Service: Gynecology;  Laterality: N/A;  with Sacrospinous Ligament Suspension   BLADDER SUSPENSION  07/25/2011   Procedure: TRANSVAGINAL TAPE (TVT) PROCEDURE;  Surgeon: Darlyn Chamber, MD;  Location: [facility];  Service: Gynecology;  Laterality: N/A;  Transobturator Sling   COLONOSCOPY  08-12-2002   tics only   CYSTOSCOPY  07/25/2011   Procedure: CYSTOSCOPY;  Surgeon: Darlyn Chamber, MD;  Location: [facility];  Service: Gynecology;  Laterality:  N/A;   LAPAROSCOPIC ASSISTED VAGINAL HYSTERECTOMY  07/25/2011   Procedure: LAPAROSCOPIC ASSISTED VAGINAL HYSTERECTOMY;  Surgeon: Darlyn Chamber, MD;  Location: [facility];  Service: Gynecology;  Laterality: N/A;   LAPAROTOMY  1981   SALPINGOOPHORECTOMY  07/25/2011   Procedure: SALPINGO OOPHERECTOMY;  Surgeon: Darlyn Chamber, MD;  Location: [facility];  Service: Gynecology;  Laterality: Bilateral;   TUBAL LIGATION      Current Medications: Current Meds  Medication Sig   Biotin 5000 MCG CAPS Take 5,000 mcg by mouth daily.   cyanocobalamin (VITAMIN B12) 1000 MCG tablet Take 1,000 mcg by mouth daily.   Ferrous Sulfate (IRON PO) Take 1 tablet by mouth daily.   loratadine (CLARITIN) 10 MG tablet Take 10 mg by mouth daily.   omeprazole (PRILOSEC) 20 MG capsule Take 20 mg by mouth daily.   rosuvastatin (CRESTOR) 10 MG tablet TAKE 1 TABLET(10 MG) BY MOUTH DAILY    Allergies:   Atorvastatin and Tape   Social History   Socioeconomic History   Marital status: Married    Spouse name: Not on file   Number of children: Not on file   Years of education: Not on file   Highest education level: Not on file  Occupational History   Not on file  Tobacco Use  Smoking status: Former   Smokeless tobacco: Never  Substance and Sexual Activity   Alcohol use: Yes    Alcohol/week: 0.0 standard drinks of alcohol    Comment: socially- daily has 2 drinks a day    Drug use: No   Sexual activity: Not on file  Other Topics Concern   Not on file  Social History Narrative   Not on file   Social Determinants of Health   Financial Resource Strain: Not on file  Food Insecurity: Not on file  Transportation Needs: Not on file  Physical Activity: Not on file  Stress: Not on file  Social Connections: Not on file     Family History:  The patient's family history is not on file. She was adopted.   ROS:   Please see the history of present illness.    ROS All other systems reviewed and are negative.      No data to  display             PHYSICAL EXAM:   VS:  BP 136/68   Pulse 71   Ht 5' 0.5" (1.537 m)   Wt 124 lb (56.2 kg)   SpO2 100%   BMI 23.82 kg/m    GEN: Well nourished, well developed in no acute distress HEENT: Normal NECK: No JVD; No carotid bruits LYMPHATICS: No lymphadenopathy CARDIAC:RRR, no murmurs, rubs, gallops RESPIRATORY:  Clear to auscultation without rales, wheezing or rhonchi  ABDOMEN: Soft, non-tender, non-distended MUSCULOSKELETAL:  No edema; No deformity  SKIN: Warm and dry NEUROLOGIC:  Alert and oriented x 3 PSYCHIATRIC:  Normal affect   Wt Readings from Last 3 Encounters:  01/12/22 124 lb (56.2 kg)  12/16/20 120 lb (54.4 kg)  10/13/20 121 lb (54.9 kg)      Studies/Labs Reviewed:   EKG:  EKG is ordered today and demonstrates NSR with nonspecific T wave abnormality  Recent Labs: 01/06/2022: ALT 10   Lipid Panel    Component Value Date/Time   CHOL 105 01/06/2022 0737   TRIG 98 01/06/2022 0737   HDL 56 01/06/2022 0737   CHOLHDL 1.9 01/06/2022 0737   LDLCALC 31 01/06/2022 0737       Additional studies/ records that were reviewed today include:  OV notes from PCP    ASSESSMENT:    1. Agatston coronary artery calcium score less than 100   2. Pure hypercholesterolemia      PLAN:  In order of problems listed above:  Coronary artery calcifications -coronary Ca score 0.84 -nuclear stress test 2022 was normal -denies any chest pain -continue statin  HLD -LDL goal <70 -I have personally reviewed and interpreted outside labs performed by patient's PCP which showed LDL 31, HDL 56 on 12/2021 -continue prescription drug management with Crestor '10mg'$  daily with PRN refills  Medication Adjustments/Labs and Tests Ordered: Current medicines are reviewed at length with the patient today.  Concerns regarding medicines are outlined above.  Medication changes, Labs and Tests ordered today are listed in the Patient Instructions below.  There are no  Patient Instructions on file for this visit.   Signed, Fransico Him, MD  01/12/2022 9:47 AM    [facility] [place], [place], [place]  [address] Phone: [phone]; Fax: [phone]

## 2022-10-02 ENCOUNTER — Encounter: Payer: Self-pay | Admitting: Internal Medicine

## 2022-10-02 DIAGNOSIS — Z0000 Encounter for general adult medical examination without abnormal findings: Secondary | ICD-10-CM

## 2022-10-10 ENCOUNTER — Other Ambulatory Visit: Payer: Self-pay | Admitting: Internal Medicine

## 2022-10-10 DIAGNOSIS — Z0000 Encounter for general adult medical examination without abnormal findings: Secondary | ICD-10-CM

## 2022-11-06 ENCOUNTER — Ambulatory Visit
Admission: RE | Admit: 2022-11-06 | Discharge: 2022-11-06 | Disposition: A | Discharge status: Home / Self Care | Payer: Medicare Other | Source: Ambulatory Visit | Attending: Internal Medicine | Admitting: Internal Medicine

## 2022-11-06 DIAGNOSIS — Z0000 Encounter for general adult medical examination without abnormal findings: Secondary | ICD-10-CM

## 2022-11-09 ENCOUNTER — Other Ambulatory Visit: Payer: Self-pay | Admitting: Internal Medicine

## 2022-11-09 DIAGNOSIS — M810 Age-related osteoporosis without current pathological fracture: Secondary | ICD-10-CM

## 2023-02-20 ENCOUNTER — Other Ambulatory Visit: Payer: Self-pay | Admitting: Cardiology

## 2023-04-06 ENCOUNTER — Ambulatory Visit: Payer: Medicare Other | Attending: Cardiology | Admitting: Cardiology

## 2023-04-06 VITALS — BP 130/70 | HR 60 | Resp 16 | Ht 60.0 in | Wt 121.0 lb

## 2023-04-06 DIAGNOSIS — E7800 Pure hypercholesterolemia, unspecified: Secondary | ICD-10-CM

## 2023-04-06 DIAGNOSIS — R931 Abnormal findings on diagnostic imaging of heart and coronary circulation: Secondary | ICD-10-CM | POA: Diagnosis not present

## 2023-04-06 DIAGNOSIS — I700 Atherosclerosis of aorta: Secondary | ICD-10-CM | POA: Diagnosis not present

## 2023-04-06 DIAGNOSIS — Z79899 Other long term (current) drug therapy: Secondary | ICD-10-CM

## 2023-04-06 MED ORDER — ROSUVASTATIN CALCIUM 10 MG PO TABS: ORAL_TABLET | ORAL | 3 refills | Status: DC

## 2023-04-06 NOTE — Progress Notes (Signed)
Cardiology Office  Note    Date:  04/06/2023   ID:  [name], DOB [birth year], MRN [id number]  PCP:  Melida Quitter, MD  Cardiologist:  Armanda Magic, MD   Chief Complaint  Patient presents with   Follow-up    Coronary artery calcifications, hyperlipidemia and aortic atherosclerosis    History of Present Illness:  [name] is a 82 y.o. female  with a history of GERD who was referred for evaluation of chest pain.A coronary Ca score of 0.84 and nuclear stress test was normal.  She is here today for followup and is doing well.  She denies any chest pain or pressure, SOB, DOE, PND, orthopnea, LE edema, dizziness, palpitations or syncope. She is compliant with her meds and is tolerating meds with no SE.    Past Medical History:  Diagnosis Date   Agatston coronary artery calcium score less than 100    0.84 on Chest CT 09/2020   Allergy    seasonal   Aortic atherosclerosis (HCC)    Arthritis    [facility]   Blood transfusion without reported diagnosis    Cellulitis    Diverticulitis    @ 3 yrs ago- treated with abx- no issues since    GERD (gastroesophageal reflux disease)    no RX   Osteopenia    Osteoporosis     Past Surgical History:  Procedure Laterality Date   ABDOMINAL HYSTERECTOMY     ANTERIOR AND POSTERIOR REPAIR  07/25/2011   Procedure: ANTERIOR (CYSTOCELE) AND POSTERIOR REPAIR (RECTOCELE);  Surgeon: Juluis Mire, MD;  Location: [facility];  Service: Gynecology;  Laterality: N/A;  with Sacrospinous Ligament Suspension   BLADDER SUSPENSION  07/25/2011   Procedure: TRANSVAGINAL TAPE (TVT) PROCEDURE;  Surgeon: Juluis Mire, MD;  Location: [facility];  Service: Gynecology;  Laterality: N/A;  Transobturator Sling   COLONOSCOPY  08-12-2002   tics only   CYSTOSCOPY  07/25/2011   Procedure: CYSTOSCOPY;  Surgeon: Juluis Mire, MD;  Location: [facility];  Service: Gynecology;  Laterality: N/A;   LAPAROSCOPIC ASSISTED VAGINAL HYSTERECTOMY  07/25/2011    Procedure: LAPAROSCOPIC ASSISTED VAGINAL HYSTERECTOMY;  Surgeon: Juluis Mire, MD;  Location: [facility];  Service: Gynecology;  Laterality: N/A;   LAPAROTOMY  1981   SALPINGOOPHORECTOMY  07/25/2011   Procedure: SALPINGO OOPHERECTOMY;  Surgeon: Juluis Mire, MD;  Location: [facility];  Service: Gynecology;  Laterality: Bilateral;   TUBAL LIGATION      Current Medications: No outpatient medications have been marked as taking for the 04/06/23 encounter (Office Visit) with Quintella Reichert, MD.    Allergies:   Atorvastatin and Tape   Social History   Socioeconomic History   Marital status: Widowed    Spouse name: Not on file   Number of children: Not on file   Years of education: Not on file   Highest education level: Not on file  Occupational History   Not on file  Tobacco Use   Smoking status: Former   Smokeless tobacco: Never  Substance and Sexual Activity   Alcohol use: Yes    Alcohol/week: 0.0 standard drinks of alcohol    Comment: socially- daily has 2 drinks a day    Drug use: No   Sexual activity: Not on file  Other Topics Concern   Not on file  Social History Narrative   Not on file   Social Determinants of Health   Financial Resource Strain:  Not on file  Food Insecurity: Not on file  Transportation Needs: Not on file  Physical Activity: Not on file  Stress: Not on file  Social Connections: Not on file     Family History:  The patient's family history is not on file. She was adopted.   ROS:   Please see the history of present illness.    ROS All other systems reviewed and are negative.      No data to display             PHYSICAL EXAM:   VS:  There were no vitals taken for this visit.   GEN: Well nourished, well developed in no acute distress HEENT: Normal NECK: No JVD; No carotid bruits LYMPHATICS: No lymphadenopathy CARDIAC:RRR, no murmurs, rubs, gallops RESPIRATORY:  Clear to auscultation without rales, wheezing or rhonchi  ABDOMEN: Soft,  non-tender, non-distended MUSCULOSKELETAL:  No edema; No deformity  SKIN: Warm and dry NEUROLOGIC:  Alert and oriented x 3 PSYCHIATRIC:  Normal affect  Wt Readings from Last 3 Encounters:  01/12/22 124 lb (56.2 kg)  12/16/20 120 lb (54.4 kg)  10/13/20 121 lb (54.9 kg)      Studies/Labs Reviewed:    Recent Labs: No results found for requested labs within last 365 days.   Lipid Panel    Component Value Date/Time   CHOL 105 01/06/2022 0737   TRIG 98 01/06/2022 0737   HDL 56 01/06/2022 0737   CHOLHDL 1.9 01/06/2022 0737   LDLCALC 31 01/06/2022 0737       Additional studies/ records that were reviewed today include:  OV notes from PCP    ASSESSMENT:    1. Agatston coronary artery calcium score less than 100   2. Pure hypercholesterolemia   3. Aortic atherosclerosis (HCC)     PLAN:  In order of problems listed above:  Coronary artery calcifications -coronary Ca score 0.84 -nuclear stress test 2022 was normal -She denies any anginal symptoms since I saw her last -she had been walking 2 miles daily until she recently got a puppy -She will continue statin therapy  HLD -LDL goal <70 -I have personally reviewed and interpreted outside labs performed by patient's PCP which showed LDL 50 and HDL 64 a year ago -Repeat FLP and ALT -Continue prescription drug management with Crestor 10 mg daily with as needed refills  Aortic atherosclerosis -Continue statin therapy  Followup with me in 1 year  Medication Adjustments/Labs and Tests Ordered: Current medicines are reviewed at length with the patient today.  Concerns regarding medicines are outlined above.  Medication changes, Labs and Tests ordered today are listed in the Patient Instructions below.  There are no Patient Instructions on file for this visit.   Signed, Armanda Magic, MD  04/06/2023 9:09 AM    [facility] [address] [place], [place], [place]  [address] Phone: [phone]; Fax:  [phone]

## 2023-04-06 NOTE — Addendum Note (Signed)
Addended by: Luellen Pucker on: 04/06/2023 09:36 AM   Modules accepted: Orders

## 2023-04-06 NOTE — Addendum Note (Signed)
Addended by: Luellen Pucker on: 04/06/2023 09:37 AM   Modules accepted: Orders

## 2023-04-06 NOTE — Patient Instructions (Signed)
Medication Instructions:  Your physician recommends that you continue on your current medications as directed. Please refer to the Current Medication list given to you today.  *If you need a refill on your cardiac medications before your next appointment, please call your pharmacy*   Lab Work: Please complete a FASTING lipid panel and an ALT in our lab today before you leave.  If you have labs (blood work) drawn today and your tests are completely normal, you will receive your results only by: MyChart Message (if you have MyChart) OR A paper copy in the mail If you have any lab test that is abnormal or we need to change your treatment, we will call you to review the results.   Testing/Procedures: None.   Follow-Up: At [facility], you and your health needs are our priority.  As part of our continuing mission to provide you with exceptional heart care, we have created designated Provider Care Teams.  These Care Teams include your primary Cardiologist (physician) and Advanced Practice Providers (APPs -  Physician Assistants and Nurse Practitioners) who all work together to provide you with the care you need, when you need it.  We recommend signing up for the patient portal called "MyChart".  Sign up information is provided on this After Visit Summary.  MyChart is used to connect with patients for Virtual Visits (Telemedicine).  Patients are able to view lab/test results, encounter notes, upcoming appointments, etc.  Non-urgent messages can be sent to your provider as well.   To learn more about what you can do with MyChart, go to [email or web address].    Your next appointment:   1 year(s)  Provider:   Armanda Magic, MD

## 2023-04-13 LAB — LIPID PANEL
Chol/HDL Ratio: 1.8 ratio (ref 0.0–4.4)
Cholesterol, Total: 99 mg/dL — ABNORMAL LOW (ref 100–199)
HDL: 55 mg/dL (ref >39)
LDL Chol Calc (NIH): 24 mg/dL (ref 0–99)
Triglycerides: 113 mg/dL (ref 0–149)
VLDL Cholesterol Cal: 20 mg/dL (ref 5–40)

## 2023-04-13 LAB — ALT: ALT: 11 [IU]/L (ref 0–32)

## 2023-05-24 ENCOUNTER — Other Ambulatory Visit: Payer: Medicare Other

## 2023-08-07 DIAGNOSIS — K219 Gastro-esophageal reflux disease without esophagitis: Secondary | ICD-10-CM | POA: Diagnosis not present

## 2023-08-07 DIAGNOSIS — M810 Age-related osteoporosis without current pathological fracture: Secondary | ICD-10-CM | POA: Diagnosis not present

## 2023-08-14 ENCOUNTER — Other Ambulatory Visit (HOSPITAL_COMMUNITY): Payer: Self-pay | Admitting: *Deleted

## 2023-08-16 ENCOUNTER — Ambulatory Visit (HOSPITAL_COMMUNITY)
Admission: RE | Admit: 2023-08-16 | Discharge: 2023-08-16 | Disposition: A | Discharge status: Home / Self Care | Source: Ambulatory Visit | Attending: Internal Medicine | Admitting: Internal Medicine

## 2023-08-16 DIAGNOSIS — M810 Age-related osteoporosis without current pathological fracture: Secondary | ICD-10-CM | POA: Insufficient documentation

## 2023-08-16 MED ORDER — ZOLEDRONIC ACID 5 MG/100ML IV SOLN
5.0000 mg | Freq: Once | INTRAVENOUS | Status: AC
  Administered 2023-08-16: 5 mg via INTRAVENOUS

## 2023-08-16 MED ORDER — ZOLEDRONIC ACID 5 MG/100ML IV SOLN
INTRAVENOUS | Status: AC
  Filled 2023-08-16: qty 100

## 2023-09-04 ENCOUNTER — Telehealth: Payer: Self-pay | Admitting: Cardiology

## 2023-09-04 MED ORDER — ROSUVASTATIN CALCIUM 10 MG PO TABS: ORAL_TABLET | ORAL | 2 refills | Status: DC

## 2023-09-04 NOTE — Telephone Encounter (Signed)
 Pt's medication was sent to pt's pharmacy as requested. Confirmation received.

## 2023-09-04 NOTE — Telephone Encounter (Signed)
*  STAT* If patient is at the pharmacy, call can be transferred to refill team.   1. Which medications need to be refilled? (please list name of each medication and dose if known)   rosuvastatin (CRESTOR) 10 MG tablet   2. Would you like to learn more about the convenience, safety, & potential cost savings by using the [facility]?   3. Are you open to using the [facility] (Type [facility]. ).  4. Which pharmacy/location (including street and city if local pharmacy) is medication to be sent to?  [id number] - [place], [place] - [address]   5. Do they need a 30 day or 90 day supply?   90 day  Patient stated she still has medication left.

## 2023-10-02 DIAGNOSIS — E785 Hyperlipidemia, unspecified: Secondary | ICD-10-CM | POA: Diagnosis not present

## 2023-10-05 DIAGNOSIS — D649 Anemia, unspecified: Secondary | ICD-10-CM | POA: Diagnosis not present

## 2023-10-05 DIAGNOSIS — E785 Hyperlipidemia, unspecified: Secondary | ICD-10-CM | POA: Diagnosis not present

## 2023-10-05 DIAGNOSIS — M810 Age-related osteoporosis without current pathological fracture: Secondary | ICD-10-CM | POA: Diagnosis not present

## 2023-10-05 DIAGNOSIS — K219 Gastro-esophageal reflux disease without esophagitis: Secondary | ICD-10-CM | POA: Diagnosis not present

## 2023-10-05 DIAGNOSIS — Z87891 Personal history of nicotine dependence: Secondary | ICD-10-CM | POA: Diagnosis not present

## 2023-10-05 DIAGNOSIS — R739 Hyperglycemia, unspecified: Secondary | ICD-10-CM | POA: Diagnosis not present

## 2023-11-12 IMAGING — MG MM DIGITAL SCREENING BILAT W/ TOMO AND CAD
8 series · 9 of 24 positions shown · non-contrast
Comparison: Previous exam(s).

CLINICAL DATA: Screening.

EXAM:
DIGITAL SCREENING BILATERAL MAMMOGRAM WITH TOMOSYNTHESIS AND CAD
TECHNIQUE: Bilateral screening digital craniocaudal and mediolateral oblique
mammograms were obtained. Bilateral screening digital breast
tomosynthesis was performed. The images were evaluated with
computer-aided detection.

[L MLO synth-2D]
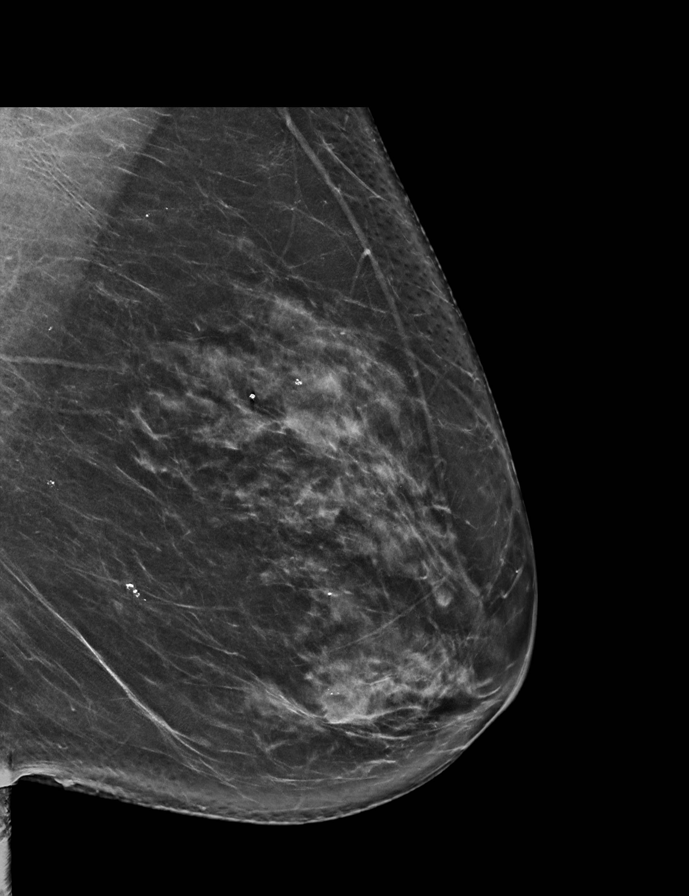

[L CC synth-2D]
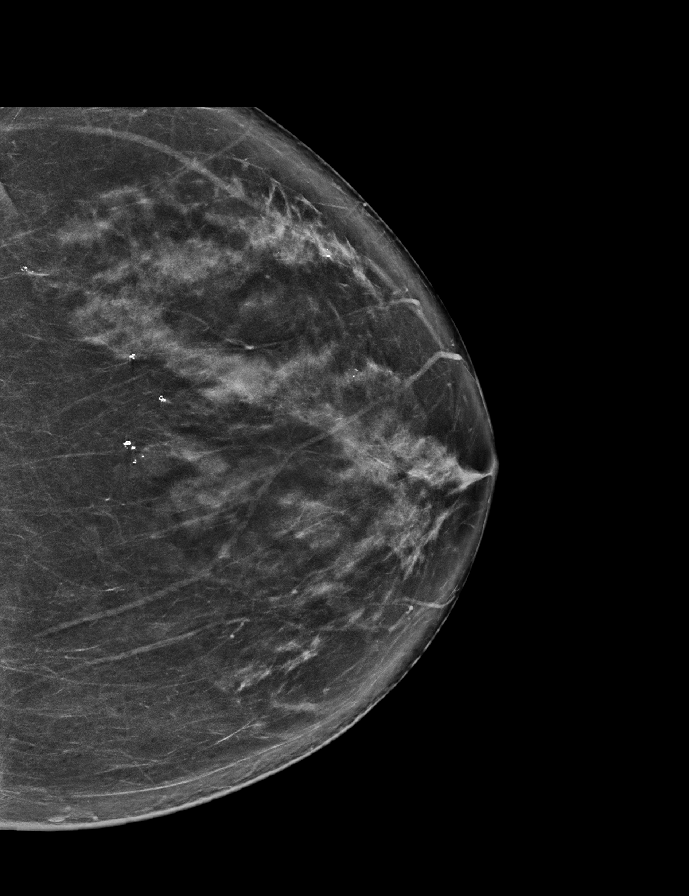

[R MLO synth-2D]
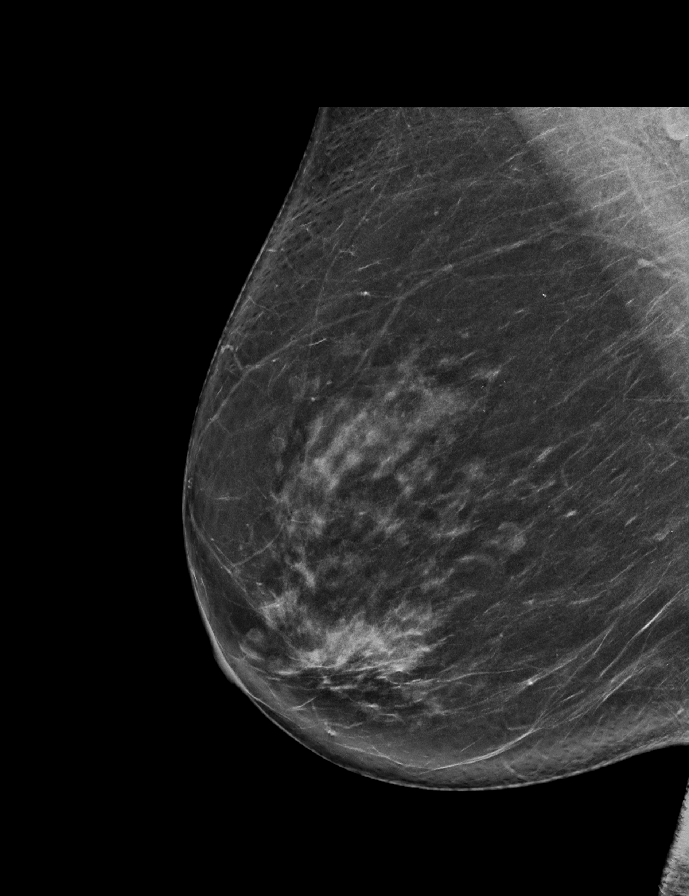

[R CC synth-2D]
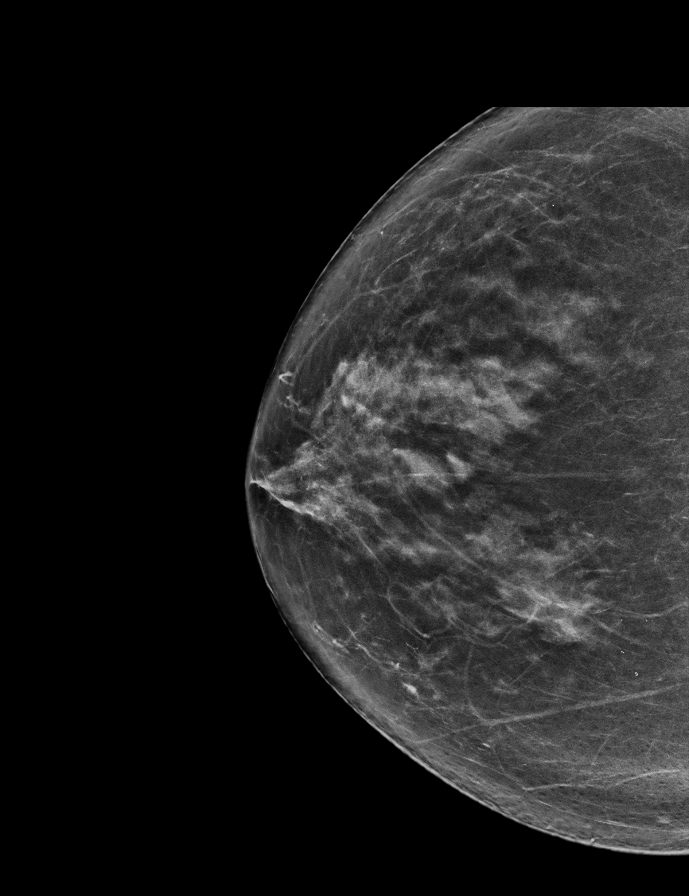

[R MLO tomo · 2 of 78 frames shown]
[frame 26/78]
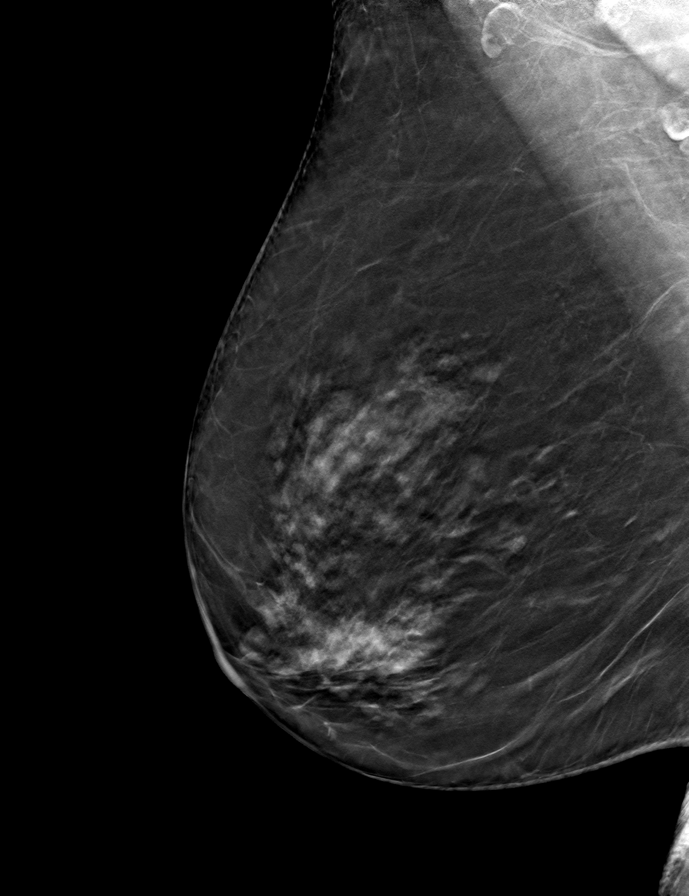
[frame 39/78]
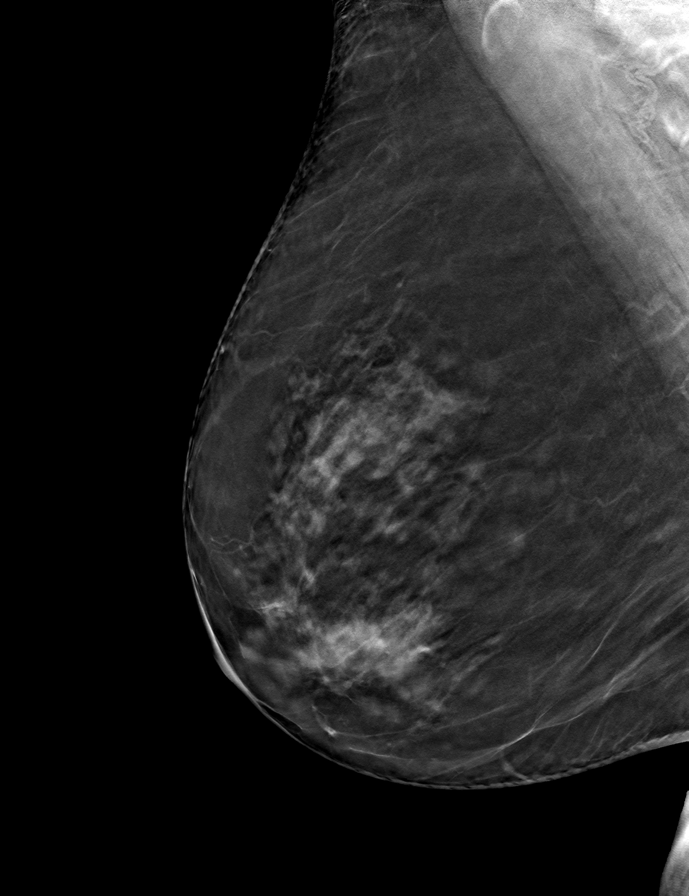

[L MLO tomo · tomo slice 39/77.0]
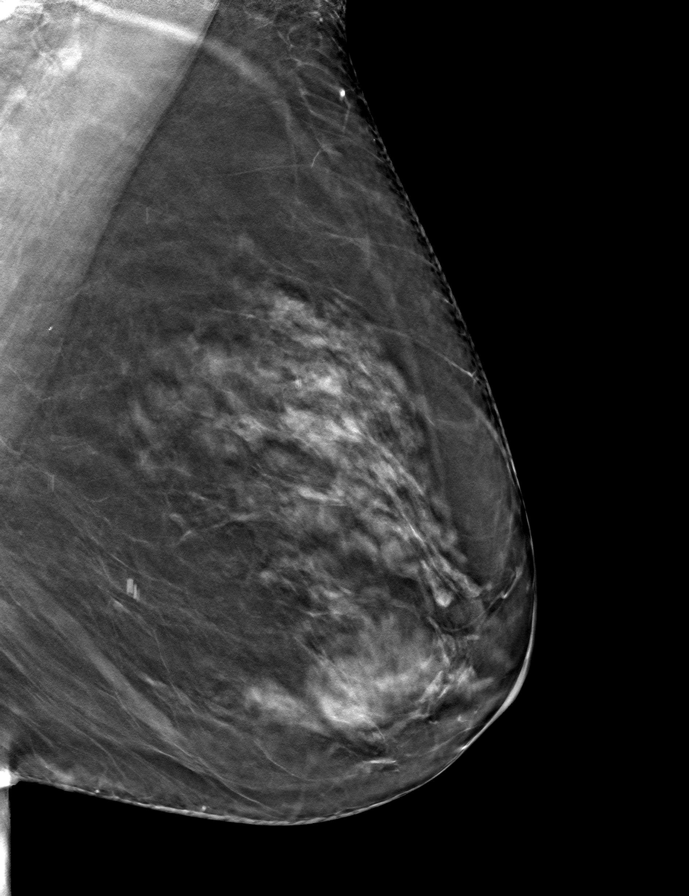

[R CC tomo · tomo slice 35/69.0]
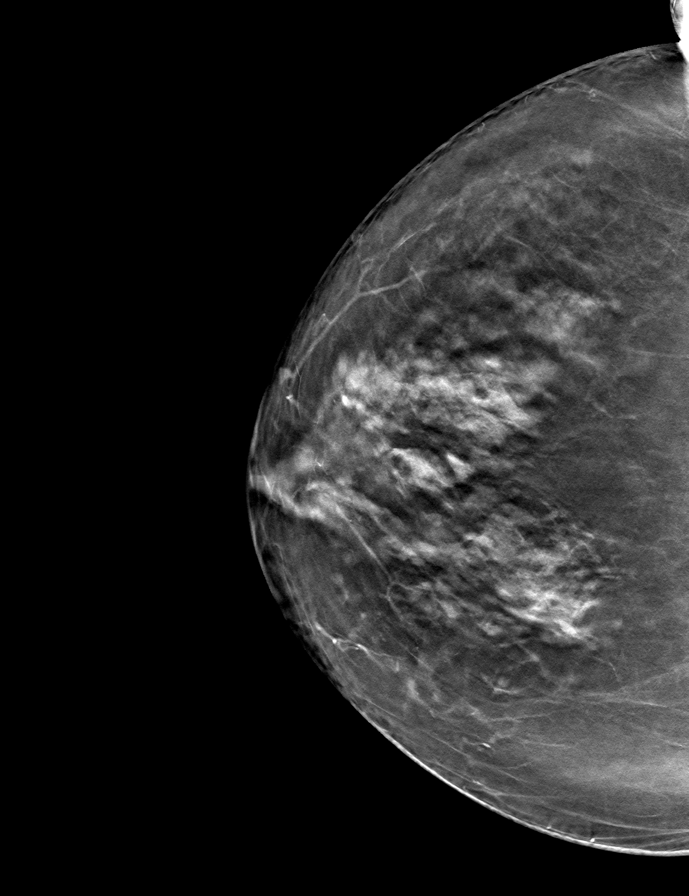

[L CC tomo · tomo slice 37/73.0]
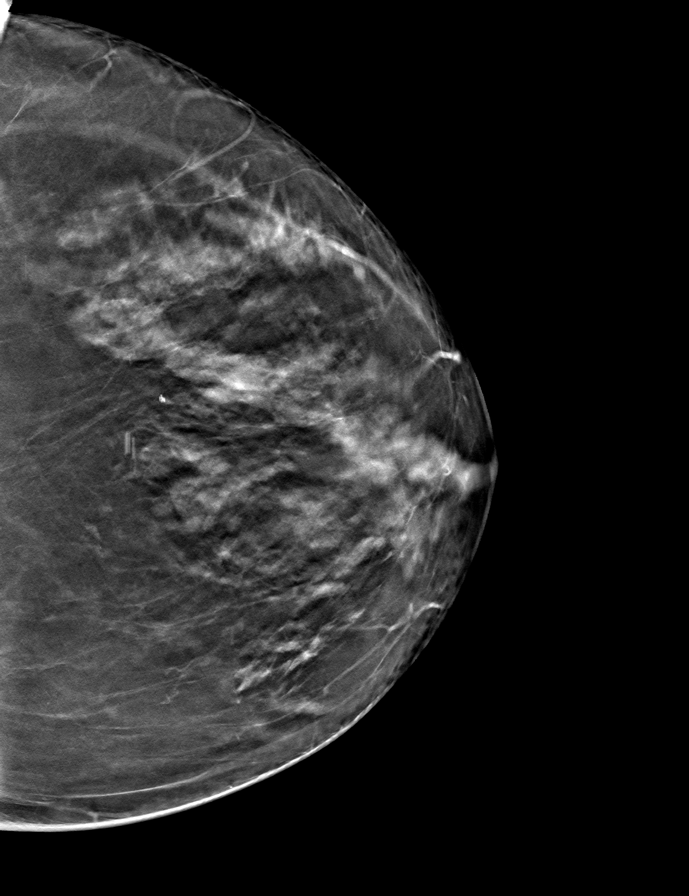

[9 of 24 positions shown; findings below may reference images not displayed]

ACR Breast Density Category c: The breast tissue is heterogeneously
dense, which may obscure small masses.
FINDINGS: There are no findings suspicious for malignancy.
IMPRESSION: No mammographic evidence of malignancy. A result letter of this
screening mammogram will be mailed directly to the patient.

RECOMMENDATION:
Screening mammogram in one year. (Code:Q3-W-BC3)

BI-RADS CATEGORY  1: Negative.

## 2023-11-28 ENCOUNTER — Telehealth: Payer: Self-pay | Admitting: Cardiology

## 2023-11-28 DIAGNOSIS — Z79899 Other long term (current) drug therapy: Secondary | ICD-10-CM

## 2023-11-28 DIAGNOSIS — Z1231 Encounter for screening mammogram for malignant neoplasm of breast: Secondary | ICD-10-CM | POA: Diagnosis not present

## 2023-11-28 DIAGNOSIS — R931 Abnormal findings on diagnostic imaging of heart and coronary circulation: Secondary | ICD-10-CM

## 2023-11-28 NOTE — Telephone Encounter (Signed)
 MC to patient to advise she can do FLP/ALT at any [facility] in 3 months. Orders placed.

## 2023-11-28 NOTE — Telephone Encounter (Signed)
 Will forward to Dr Shlomo and her nurse for review and any new orders.

## 2023-11-28 NOTE — Telephone Encounter (Signed)
 Patient wants orders for lab work prior to appointment on 11/17.

## 2024-03-27 DIAGNOSIS — E7849 Other hyperlipidemia: Secondary | ICD-10-CM | POA: Diagnosis not present

## 2024-03-27 DIAGNOSIS — D649 Anemia, unspecified: Secondary | ICD-10-CM | POA: Diagnosis not present

## 2024-03-27 DIAGNOSIS — E538 Deficiency of other specified B group vitamins: Secondary | ICD-10-CM | POA: Diagnosis not present

## 2024-03-27 DIAGNOSIS — K219 Gastro-esophageal reflux disease without esophagitis: Secondary | ICD-10-CM | POA: Diagnosis not present

## 2024-03-27 DIAGNOSIS — R739 Hyperglycemia, unspecified: Secondary | ICD-10-CM | POA: Diagnosis not present

## 2024-03-27 DIAGNOSIS — E785 Hyperlipidemia, unspecified: Secondary | ICD-10-CM | POA: Diagnosis not present

## 2024-03-31 DIAGNOSIS — Z79899 Other long term (current) drug therapy: Secondary | ICD-10-CM | POA: Diagnosis not present

## 2024-03-31 DIAGNOSIS — R931 Abnormal findings on diagnostic imaging of heart and coronary circulation: Secondary | ICD-10-CM | POA: Diagnosis not present

## 2024-03-31 LAB — LIPID PANEL
Chol/HDL Ratio: 2 ratio (ref 0.0–4.4)
Cholesterol, Total: 121 mg/dL (ref 100–199)
HDL: 62 mg/dL (ref >39)
LDL Chol Calc (NIH): 41 mg/dL (ref 0–99)
Triglycerides: 99 mg/dL (ref 0–149)
VLDL Cholesterol Cal: 18 mg/dL (ref 5–40)

## 2024-03-31 LAB — ALT: ALT: 10 IU/L (ref 0–32)

## 2024-04-01 ENCOUNTER — Ambulatory Visit: Payer: Self-pay | Admitting: Cardiology

## 2024-04-02 DIAGNOSIS — M810 Age-related osteoporosis without current pathological fracture: Secondary | ICD-10-CM | POA: Diagnosis not present

## 2024-04-02 DIAGNOSIS — E785 Hyperlipidemia, unspecified: Secondary | ICD-10-CM | POA: Diagnosis not present

## 2024-04-02 DIAGNOSIS — Z0000 Encounter for general adult medical examination without abnormal findings: Secondary | ICD-10-CM | POA: Diagnosis not present

## 2024-04-02 DIAGNOSIS — Z23 Encounter for immunization: Secondary | ICD-10-CM | POA: Diagnosis not present

## 2024-04-02 DIAGNOSIS — E538 Deficiency of other specified B group vitamins: Secondary | ICD-10-CM | POA: Diagnosis not present

## 2024-04-02 DIAGNOSIS — K219 Gastro-esophageal reflux disease without esophagitis: Secondary | ICD-10-CM | POA: Diagnosis not present

## 2024-04-02 DIAGNOSIS — K5730 Diverticulosis of large intestine without perforation or abscess without bleeding: Secondary | ICD-10-CM | POA: Diagnosis not present

## 2024-04-02 DIAGNOSIS — R739 Hyperglycemia, unspecified: Secondary | ICD-10-CM | POA: Diagnosis not present

## 2024-04-02 DIAGNOSIS — Z1331 Encounter for screening for depression: Secondary | ICD-10-CM | POA: Diagnosis not present

## 2024-04-02 DIAGNOSIS — Z1339 Encounter for screening examination for other mental health and behavioral disorders: Secondary | ICD-10-CM | POA: Diagnosis not present

## 2024-04-02 DIAGNOSIS — Z87891 Personal history of nicotine dependence: Secondary | ICD-10-CM | POA: Diagnosis not present

## 2024-04-07 ENCOUNTER — Ambulatory Visit: Attending: Cardiology | Admitting: Cardiology

## 2024-04-07 VITALS — BP 146/76 | HR 56 | Ht 60.0 in | Wt 115.4 lb

## 2024-04-07 DIAGNOSIS — E7800 Pure hypercholesterolemia, unspecified: Secondary | ICD-10-CM

## 2024-04-07 DIAGNOSIS — R931 Abnormal findings on diagnostic imaging of heart and coronary circulation: Secondary | ICD-10-CM | POA: Diagnosis not present

## 2024-04-07 MED ORDER — ROSUVASTATIN CALCIUM 10 MG PO TABS: ORAL_TABLET | ORAL | 3 refills | Status: AC

## 2024-04-07 NOTE — Patient Instructions (Signed)
 Medication Instructions:  Your physician recommends that you continue on your current medications as directed. Please refer to the Current Medication list given to you today.  *If you need a refill on your cardiac medications before your next appointment, please call your pharmacy*  Follow-Up: At [facility], you and your health needs are our priority.  As part of our continuing mission to provide you with exceptional heart care, our providers are all part of one team.  This team includes your primary Cardiologist (physician) and Advanced Practice Providers or APPs (Physician Assistants and Nurse Practitioners) who all work together to provide you with the care you need, when you need it.  Your next appointment:   1 year(s)  Provider:   Gaylyn Keas, MD    We recommend signing up for the patient portal called "MyChart".  Sign up information is provided on this After Visit Summary.  MyChart is used to connect with patients for Virtual Visits (Telemedicine).  Patients are able to view lab/test results, encounter notes, upcoming appointments, etc.  Non-urgent messages can be sent to your provider as well.   To learn more about what you can do with MyChart, go to [email or web address].

## 2024-04-07 NOTE — Progress Notes (Unsigned)
 Cardiology Office  Note    Date:  04/07/2024   ID:  [name], DOB [birth year], MRN [id number]  PCP:  Stephane Leita DEL, MD  Cardiologist:  Wilbert Bihari, MD   Chief Complaint  Patient presents with   Follow-up    Coronary artery calcifications and hyperlipidemia    History of Present Illness:  [name] is a 83 y.o. female  with a history of GERD who was referred for evaluation of chest pain.A coronary Ca score of 0.84 and nuclear stress test was normal.  She is here today and is doing well.  She denies any chest pain or pressure, SOB, DOE, PND, orthopnea, lower extremity edema, dizziness, syncope or palpitations.  Past Medical History:  Diagnosis Date   Agatston coronary artery calcium  score less than 100    0.84 on Chest CT 09/2020   Allergy    seasonal   Aortic atherosclerosis    Arthritis    [facility]   Blood transfusion without reported diagnosis    Cellulitis    Diverticulitis    @ 3 yrs ago- treated with abx- no issues since    GERD (gastroesophageal reflux disease)    no RX   Osteopenia    Osteoporosis     Past Surgical History:  Procedure Laterality Date   ABDOMINAL HYSTERECTOMY     ANTERIOR AND POSTERIOR REPAIR  07/25/2011   Procedure: ANTERIOR (CYSTOCELE) AND POSTERIOR REPAIR (RECTOCELE);  Surgeon: Norleen GORMAN Skill, MD;  Location: [facility];  Service: Gynecology;  Laterality: N/A;  with Sacrospinous Ligament Suspension   BLADDER SUSPENSION  07/25/2011   Procedure: TRANSVAGINAL TAPE (TVT) PROCEDURE;  Surgeon: Norleen GORMAN Skill, MD;  Location: [facility];  Service: Gynecology;  Laterality: N/A;  Transobturator Sling   COLONOSCOPY  08-12-2002   tics only   CYSTOSCOPY  07/25/2011   Procedure: CYSTOSCOPY;  Surgeon: Norleen GORMAN Skill, MD;  Location: [facility];  Service: Gynecology;  Laterality: N/A;   LAPAROSCOPIC ASSISTED VAGINAL HYSTERECTOMY  07/25/2011   Procedure: LAPAROSCOPIC ASSISTED VAGINAL HYSTERECTOMY;  Surgeon: Norleen GORMAN Skill, MD;  Location: [facility];   Service: Gynecology;  Laterality: N/A;   LAPAROTOMY  1981   SALPINGOOPHORECTOMY  07/25/2011   Procedure: SALPINGO OOPHERECTOMY;  Surgeon: Norleen GORMAN Skill, MD;  Location: [facility];  Service: Gynecology;  Laterality: Bilateral;   TUBAL LIGATION      Current Medications: Current Meds  Medication Sig   Calcium  Carb-Cholecalciferol (CALCIUM  500+D3 PO) Take 1 capsule by mouth daily.   cyanocobalamin (VITAMIN B12) 1000 MCG tablet Take 1,000 mcg by mouth daily.   loratadine (CLARITIN) 10 MG tablet Take 10 mg by mouth daily.   MAGNESIUM  GLYCINATE PO Take 240 mg by mouth daily.   Multiple Vitamins-Minerals (WOMENS MULTI GUMMIES) CHEW Chew by mouth daily.   omeprazole (PRILOSEC) 20 MG capsule Take 20 mg by mouth daily.   [DISCONTINUED] rosuvastatin  (CRESTOR ) 10 MG tablet TAKE 1 TABLET(10 MG) BY MOUTH DAILY    Allergies:   Atorvastatin  and Tape   Social History   Socioeconomic History   Marital status: Widowed    Spouse name: Not on file   Number of children: Not on file   Years of education: Not on file   Highest education level: Not on file  Occupational History   Not on file  Tobacco Use   Smoking status: Former   Smokeless tobacco: Never  Substance and Sexual Activity   Alcohol use: Yes    Alcohol/week:  0.0 standard drinks of alcohol    Comment: socially- daily has 2 drinks a day    Drug use: No   Sexual activity: Not on file  Other Topics Concern   Not on file  Social History Narrative   Not on file   Social Drivers of Health   Financial Resource Strain: Not on file  Food Insecurity: Not on file  Transportation Needs: Not on file  Physical Activity: Not on file  Stress: Not on file  Social Connections: Not on file     Family History:  The patient's family history is not on file. She was adopted.   ROS:   Please see the history of present illness.    ROS All other systems reviewed and are negative.      No data to display             PHYSICAL EXAM:   VS:  BP  (!) 146/74 (BP Location: Left Arm, Patient Position: Sitting, Cuff Size: Normal)   Pulse (!) 56   Ht 5' (1.524 m)   Wt 115 lb 6.4 oz (52.3 kg)   SpO2 98%   BMI 22.54 kg/m    GEN: Well nourished, well developed in no acute distress HEENT: Normal NECK: No JVD; No carotid bruits LYMPHATICS: No lymphadenopathy CARDIAC:RRR, no murmurs, rubs, gallops RESPIRATORY:  Clear to auscultation without rales, wheezing or rhonchi  ABDOMEN: Soft, non-tender, non-distended MUSCULOSKELETAL:  No edema; No deformity  SKIN: Warm and dry NEUROLOGIC:  Alert and oriented x 3 PSYCHIATRIC:  Normal affect  Wt Readings from Last 3 Encounters:  04/07/24 115 lb 6.4 oz (52.3 kg)  08/16/23 114 lb (51.7 kg)  04/06/23 121 lb (54.9 kg)      Studies/Labs Reviewed:   EKG Interpretation Date/Time:  Monday April 07 2024 11:14:04 EST Ventricular Rate:  56 PR Interval:  190 QRS Duration:  78 QT Interval:  434 QTC Calculation: 418 R Axis:   -22  Text Interpretation: Sinus bradycardia Cannot rule out Anterior infarct , age undetermined When compared with ECG of 21-Aug-2020 17:43, Nonspecific T wave abnormality no longer evident in Lateral leads Confirmed by Shlomo Corning [id number]) on 04/07/2024 11:23:16 AM    Recent Labs: 03/31/2024: ALT 10   Lipid Panel    Component Value Date/Time   CHOL 121 03/31/2024 0925   TRIG 99 03/31/2024 0925   HDL 62 03/31/2024 0925   CHOLHDL 2.0 03/31/2024 0925   LDLCALC 41 03/31/2024 0925       Additional studies/ records that were reviewed today include:  OV notes from PCP    ASSESSMENT:    1. Agatston coronary artery calcium  score less than 100   2. Pure hypercholesterolemia      PLAN:  In order of problems listed above:  Coronary artery calcifications -coronary Ca score 0.84 -nuclear stress test 2022 was normal -She has not had any anginal symptoms since I saw her last -Continue Crestor  10 mg daily with as needed refills  HLD -LDL goal <70 -I have  personally reviewed and interpreted outside labs performed by patient's PCP which showed LDL 41, HDL 62, Tag 99 and ALT 10 88/89/7974 -Continue Crestor  10 mg daily with as needed refills  Aortic atherosclerosis -Continue statin therapy  Followup with me in 1 year  Medication Adjustments/Labs and Tests Ordered: Current medicines are reviewed at length with the patient today.  Concerns regarding medicines are outlined above.  Medication changes, Labs and Tests ordered today are listed in the Patient Instructions  below.  There are no Patient Instructions on file for this visit.   Signed, Wilbert Bihari, MD  04/07/2024 11:23 AM    [facility] [address] [place], [place], [place]  [address] Phone: [phone]; Fax: [phone]

## 2024-04-20 ENCOUNTER — Emergency Department (HOSPITAL_BASED_OUTPATIENT_CLINIC_OR_DEPARTMENT_OTHER)
Admission: EM | Admit: 2024-04-20 | Discharge: 2024-04-20 | Disposition: A | Discharge status: Home / Self Care | Attending: Emergency Medicine | Admitting: Emergency Medicine

## 2024-04-20 ENCOUNTER — Encounter (HOSPITAL_BASED_OUTPATIENT_CLINIC_OR_DEPARTMENT_OTHER): Payer: Self-pay

## 2024-04-20 ENCOUNTER — Other Ambulatory Visit: Payer: Self-pay

## 2024-04-20 DIAGNOSIS — M545 Low back pain, unspecified: Secondary | ICD-10-CM | POA: Diagnosis present

## 2024-04-20 DIAGNOSIS — G5702 Lesion of sciatic nerve, left lower limb: Secondary | ICD-10-CM | POA: Insufficient documentation

## 2024-04-20 DIAGNOSIS — M5442 Lumbago with sciatica, left side: Secondary | ICD-10-CM | POA: Diagnosis not present

## 2024-04-20 DIAGNOSIS — M5432 Sciatica, left side: Secondary | ICD-10-CM

## 2024-04-20 MED ORDER — PREDNISONE 20 MG PO TABS: 20.0000 mg | ORAL_TABLET | Freq: Every day | ORAL | 0 refills | Status: AC

## 2024-04-20 MED ORDER — NAPROXEN 250 MG PO TABS
250.0000 mg | ORAL_TABLET | Freq: Once | ORAL | Status: AC
  Administered 2024-04-20: 250 mg via ORAL

## 2024-04-20 MED ORDER — DICLOFENAC SODIUM 75 MG PO TBEC: 75.0000 mg | DELAYED_RELEASE_TABLET | Freq: Two times a day (BID) | ORAL | 0 refills | Status: AC

## 2024-04-20 MED ORDER — PREDNISONE 50 MG PO TABS
60.0000 mg | ORAL_TABLET | Freq: Once | ORAL | Status: AC
  Administered 2024-04-20: 60 mg via ORAL
  Filled 2024-04-20: qty 1

## 2024-04-20 MED ORDER — NAPROXEN 250 MG PO TABS
500.0000 mg | ORAL_TABLET | Freq: Once | ORAL | Status: DC
  Filled 2024-04-20: qty 2

## 2024-04-20 NOTE — ED Triage Notes (Signed)
 Reports L sided lower back pain radiates to LLE since June. No known injury.  Denies urinary symptoms   ambulatory

## 2024-04-20 NOTE — ED Provider Notes (Signed)
 [facility] Provider Note   CSN: [id number] Arrival date & time: 04/20/24  9048     Patient presents with: Back Pain   [name] is a 83 y.o. female.   Patient is an 83 year old female presenting for pain in her left buttocks that radiates down her left posterior lateral leg and ends in her left foot.  She states she has gotten this before but the most recent episode was provoked after a 6.5-hour car drive and then a repeat 8.4-ynlm car drive over the holiday.  He denies any sensation or motor dysfunction.  She denies any calf pain or swelling.  Pain is sharp and shooting like.  No low back pain-despite triage note.  Patient adamantly states she has no low back pain.  Pain starts in the buttocks.  No bowel or urinary incontinence.  The history is provided by the patient. No language interpreter was used.  Back Pain Associated symptoms: no abdominal pain, no chest pain, no dysuria and no fever        Prior to Admission medications   Medication Sig Start Date End Date Taking? Authorizing Provider  diclofenac (VOLTAREN) 75 MG EC tablet Take 1 tablet (75 mg total) by mouth 2 (two) times daily. 04/20/24  Yes Elnor Hila P, DO  predniSONE (DELTASONE) 20 MG tablet Take 1 tablet (20 mg total) by mouth daily for 5 days. 04/20/24 04/25/24 Yes Elnor Hila P, DO  Calcium  Carb-Cholecalciferol (CALCIUM  500+D3 PO) Take 1 capsule by mouth daily.    [provider]  cyanocobalamin (VITAMIN B12) 1000 MCG tablet Take 1,000 mcg by mouth daily.    [provider]  loratadine (CLARITIN) 10 MG tablet Take 10 mg by mouth daily.    [provider]  MAGNESIUM  GLYCINATE PO Take 240 mg by mouth daily.    [provider]  Multiple Vitamins-Minerals (WOMENS MULTI GUMMIES) CHEW Chew by mouth daily.    [provider]  omeprazole (PRILOSEC) 20 MG capsule Take 20 mg by mouth daily.    [provider]   rosuvastatin  (CRESTOR ) 10 MG tablet TAKE 1 TABLET(10 MG) BY MOUTH DAILY 04/07/24   Shlomo Wilbert SAUNDERS, MD    Allergies: Atorvastatin  and Tape    Review of Systems  Constitutional:  Negative for chills and fever.  HENT:  Negative for ear pain and sore throat.   Eyes:  Negative for pain and visual disturbance.  Respiratory:  Negative for cough and shortness of breath.   Cardiovascular:  Negative for chest pain and palpitations.  Gastrointestinal:  Negative for abdominal pain and vomiting.  Genitourinary:  Negative for dysuria and hematuria.  Musculoskeletal:  Negative for arthralgias and back pain.  Skin:  Negative for color change and rash.  Neurological:  Negative for seizures and syncope.  All other systems reviewed and are negative.   Updated Vital Signs BP (!) 162/74 (BP Location: Left Arm)   Pulse 62   Temp 98 F (36.7 C) (Oral)   Resp 16   SpO2 99%   Physical Exam Vitals and nursing note reviewed.  Constitutional:      General: She is not in acute distress.    Appearance: She is well-developed.  HENT:     Head: Normocephalic and atraumatic.  Eyes:     Conjunctiva/sclera: Conjunctivae normal.  Cardiovascular:     Rate and Rhythm: Normal rate and regular rhythm.     Heart sounds: No murmur heard. Pulmonary:  Effort: Pulmonary effort is normal. No respiratory distress.     Breath sounds: Normal breath sounds.  Abdominal:     Palpations: Abdomen is soft.     Tenderness: There is no abdominal tenderness.  Musculoskeletal:        General: No swelling.     Cervical back: Neck supple. No tenderness or bony tenderness.     Thoracic back: No tenderness or bony tenderness.     Lumbar back: No tenderness or bony tenderness.  Skin:    General: Skin is warm and dry.     Capillary Refill: Capillary refill takes less than 2 seconds.  Neurological:     Mental Status: She is alert.     Sensory: Sensation is intact.     Motor: Motor function is intact.  Psychiatric:         Mood and Affect: Mood normal.     (all labs ordered are listed, but only abnormal results are displayed) Labs Reviewed  URINALYSIS, ROUTINE W REFLEX MICROSCOPIC    EKG: None  Radiology: No results found.   Procedures   Medications Ordered in the ED  naproxen (NAPROSYN) tablet 500 mg (has no administration in time range)  predniSONE (DELTASONE) tablet 60 mg (has no administration in time range)                                    Medical Decision Making Amount and/or Complexity of Data Reviewed Labs: ordered.  Risk Prescription drug management.   83 year old female presenting for pain in her left buttocks that radiates down her left posterior lateral leg and ends in her left foot.  She states she has gotten this before but the most recent episode was provoked after a 6.5-hour car drive and then a repeat 8.4-ynlm car drive over the holiday.  He denies any sensation or motor dysfunction.  She denies any calf pain or swelling.  Pain is sharp and shooting like.  No low back pain.  Aox3, no acute distress, afebrile, with stable vitals. No neurovascular deficits. No signs of cauda equina syndrome, spinal epidural abscess, transverse myelitis, hx of trauma, or concerning hx/physical for cancerous etiology. Pain likely sciatica secondary to piriformis syndrome   Medication for pain control steroids given in ED.  Patient in no distress and overall condition improved here in the ED. Detailed discussions were had with the patient regarding current findings, and need for close f/u with PCP or on call doctor. The patient has been instructed to return immediately if the symptoms worsen in any way for re-evaluation. Patient verbalized understanding and is in agreement with current care plan. All questions answered prior to discharge.      Final diagnoses:  Piriformis syndrome of left side  Left sided sciatica    ED Discharge Orders          Ordered    predniSONE (DELTASONE) 20  MG tablet  Daily        04/20/24 1039    diclofenac (VOLTAREN) 75 MG EC tablet  2 times daily        04/20/24 1039               Elnor Bernarda SQUIBB, DO 04/20/24 1040
